# Patient Record
Sex: Female | Born: 1970 | Race: White | Hispanic: No | Marital: Married | Smoking: Former smoker
Health system: Southern US, Community
[De-identification: ages and names within clinical notes are randomized; demographics above are authoritative.]

## PROBLEM LIST (undated history)

## (undated) DIAGNOSIS — R87619 Unspecified abnormal cytological findings in specimens from cervix uteri: Secondary | ICD-10-CM

## (undated) DIAGNOSIS — N816 Rectocele: Secondary | ICD-10-CM

## (undated) DIAGNOSIS — K219 Gastro-esophageal reflux disease without esophagitis: Secondary | ICD-10-CM

## (undated) DIAGNOSIS — D219 Benign neoplasm of connective and other soft tissue, unspecified: Secondary | ICD-10-CM

## (undated) HISTORY — DX: Rectocele: N81.6

## (undated) HISTORY — DX: Benign neoplasm of connective and other soft tissue, unspecified: D21.9

## (undated) HISTORY — DX: Unspecified abnormal cytological findings in specimens from cervix uteri: R87.619

## (undated) HISTORY — DX: Gastro-esophageal reflux disease without esophagitis: K21.9

---

## 2005-11-27 HISTORY — PX: CERVIX SURGERY: SHX593

## 2008-12-20 ENCOUNTER — Inpatient Hospital Stay (HOSPITAL_COMMUNITY): Admission: AD | Admit: 2008-12-20 | Discharge: 2008-12-24 | Payer: Self-pay | Admitting: Obstetrics and Gynecology

## 2008-12-22 ENCOUNTER — Encounter (INDEPENDENT_AMBULATORY_CARE_PROVIDER_SITE_OTHER): Payer: Self-pay | Admitting: Obstetrics and Gynecology

## 2010-11-26 ENCOUNTER — Inpatient Hospital Stay (HOSPITAL_COMMUNITY): Admission: AD | Admit: 2010-11-26 | Payer: Self-pay | Admitting: Obstetrics and Gynecology

## 2011-03-13 LAB — RPR: RPR Ser Ql: NONREACTIVE

## 2011-03-13 LAB — CBC
HCT: 30.5 % — ABNORMAL LOW (ref 36.0–46.0)
HCT: 40.1 % (ref 36.0–46.0)
Hemoglobin: 10.4 g/dL — ABNORMAL LOW (ref 12.0–15.0)
Hemoglobin: 13.6 g/dL (ref 12.0–15.0)
MCHC: 33.9 g/dL (ref 30.0–36.0)
Platelets: 185 10*3/uL (ref 150–400)
Platelets: 208 10*3/uL (ref 150–400)
RBC: 3.29 MIL/uL — ABNORMAL LOW (ref 3.87–5.11)
RBC: 4.1 MIL/uL (ref 3.87–5.11)
RDW: 13.4 % (ref 11.5–15.5)
WBC: 10.4 10*3/uL (ref 4.0–10.5)
WBC: 9 10*3/uL (ref 4.0–10.5)

## 2011-03-14 ENCOUNTER — Inpatient Hospital Stay (HOSPITAL_COMMUNITY)
Admission: AD | Admit: 2011-03-14 | Discharge: 2011-03-15 | DRG: 373 | Disposition: A | Discharge status: Home / Self Care | Payer: BC Managed Care – PPO | Source: Ambulatory Visit | Attending: Obstetrics & Gynecology | Admitting: Obstetrics & Gynecology

## 2011-03-14 LAB — CBC
Hemoglobin: 12.3 g/dL (ref 12.0–15.0)
MCH: 29.3 pg (ref 26.0–34.0)
MCHC: 32.5 g/dL (ref 30.0–36.0)
MCV: 90.2 fL (ref 78.0–100.0)
RBC: 4.2 MIL/uL (ref 3.87–5.11)

## 2011-03-14 LAB — RPR: RPR Ser Ql: NONREACTIVE

## 2011-03-15 LAB — CBC
HCT: 36 % (ref 36.0–46.0)
Hemoglobin: 11.8 g/dL — ABNORMAL LOW (ref 12.0–15.0)
MCH: 29.6 pg (ref 26.0–34.0)
MCHC: 32.8 g/dL (ref 30.0–36.0)
MCV: 90.2 fL (ref 78.0–100.0)
RDW: 13.9 % (ref 11.5–15.5)

## 2013-07-21 ENCOUNTER — Other Ambulatory Visit: Payer: Self-pay

## 2013-07-21 DIAGNOSIS — Z1231 Encounter for screening mammogram for malignant neoplasm of breast: Secondary | ICD-10-CM

## 2013-08-18 ENCOUNTER — Ambulatory Visit
Admission: RE | Admit: 2013-08-18 | Discharge: 2013-08-18 | Disposition: A | Discharge status: Home / Self Care | Payer: BC Managed Care – PPO | Source: Ambulatory Visit

## 2013-08-18 DIAGNOSIS — Z1231 Encounter for screening mammogram for malignant neoplasm of breast: Secondary | ICD-10-CM

## 2013-08-21 ENCOUNTER — Other Ambulatory Visit: Payer: Self-pay | Admitting: Obstetrics and Gynecology

## 2013-08-21 DIAGNOSIS — R928 Other abnormal and inconclusive findings on diagnostic imaging of breast: Secondary | ICD-10-CM

## 2013-09-23 ENCOUNTER — Ambulatory Visit
Admission: RE | Admit: 2013-09-23 | Discharge: 2013-09-23 | Disposition: A | Discharge status: Home / Self Care | Payer: BC Managed Care – PPO | Source: Ambulatory Visit | Attending: Obstetrics and Gynecology | Admitting: Obstetrics and Gynecology

## 2013-09-23 DIAGNOSIS — R928 Other abnormal and inconclusive findings on diagnostic imaging of breast: Secondary | ICD-10-CM

## 2016-01-19 ENCOUNTER — Other Ambulatory Visit: Payer: Self-pay

## 2016-01-19 DIAGNOSIS — Z1231 Encounter for screening mammogram for malignant neoplasm of breast: Secondary | ICD-10-CM

## 2016-02-14 ENCOUNTER — Ambulatory Visit
Admission: RE | Admit: 2016-02-14 | Discharge: 2016-02-14 | Disposition: A | Discharge status: Home / Self Care | Payer: BLUE CROSS/BLUE SHIELD | Source: Ambulatory Visit

## 2016-02-14 DIAGNOSIS — Z1231 Encounter for screening mammogram for malignant neoplasm of breast: Secondary | ICD-10-CM

## 2017-04-11 DIAGNOSIS — Z0000 Encounter for general adult medical examination without abnormal findings: Secondary | ICD-10-CM | POA: Diagnosis not present

## 2017-04-11 DIAGNOSIS — R5382 Chronic fatigue, unspecified: Secondary | ICD-10-CM | POA: Diagnosis not present

## 2017-04-11 DIAGNOSIS — N951 Menopausal and female climacteric states: Secondary | ICD-10-CM | POA: Diagnosis not present

## 2017-04-11 DIAGNOSIS — Z23 Encounter for immunization: Secondary | ICD-10-CM | POA: Diagnosis not present

## 2017-09-25 DIAGNOSIS — Z1231 Encounter for screening mammogram for malignant neoplasm of breast: Secondary | ICD-10-CM | POA: Diagnosis not present

## 2017-09-25 DIAGNOSIS — Z1151 Encounter for screening for human papillomavirus (HPV): Secondary | ICD-10-CM | POA: Diagnosis not present

## 2017-09-25 DIAGNOSIS — Z6823 Body mass index (BMI) 23.0-23.9, adult: Secondary | ICD-10-CM | POA: Diagnosis not present

## 2017-09-25 DIAGNOSIS — Z01419 Encounter for gynecological examination (general) (routine) without abnormal findings: Secondary | ICD-10-CM | POA: Diagnosis not present

## 2018-07-08 ENCOUNTER — Other Ambulatory Visit: Payer: Self-pay | Admitting: Obstetrics and Gynecology

## 2018-07-08 DIAGNOSIS — Z1231 Encounter for screening mammogram for malignant neoplasm of breast: Secondary | ICD-10-CM

## 2018-08-15 ENCOUNTER — Ambulatory Visit: Payer: BLUE CROSS/BLUE SHIELD

## 2018-09-30 ENCOUNTER — Ambulatory Visit
Admission: RE | Admit: 2018-09-30 | Discharge: 2018-09-30 | Disposition: A | Discharge status: Home / Self Care | Payer: BLUE CROSS/BLUE SHIELD | Source: Ambulatory Visit | Attending: Obstetrics and Gynecology | Admitting: Obstetrics and Gynecology

## 2018-09-30 DIAGNOSIS — Z1231 Encounter for screening mammogram for malignant neoplasm of breast: Secondary | ICD-10-CM | POA: Diagnosis not present

## 2018-10-16 DIAGNOSIS — Z130 Encounter for screening for diseases of the blood and blood-forming organs and certain disorders involving the immune mechanism: Secondary | ICD-10-CM | POA: Diagnosis not present

## 2018-10-16 DIAGNOSIS — Z1151 Encounter for screening for human papillomavirus (HPV): Secondary | ICD-10-CM | POA: Diagnosis not present

## 2018-10-16 DIAGNOSIS — Z0000 Encounter for general adult medical examination without abnormal findings: Secondary | ICD-10-CM | POA: Diagnosis not present

## 2018-10-16 DIAGNOSIS — Z6823 Body mass index (BMI) 23.0-23.9, adult: Secondary | ICD-10-CM | POA: Diagnosis not present

## 2018-10-16 DIAGNOSIS — Z1329 Encounter for screening for other suspected endocrine disorder: Secondary | ICD-10-CM | POA: Diagnosis not present

## 2018-10-16 DIAGNOSIS — Z01419 Encounter for gynecological examination (general) (routine) without abnormal findings: Secondary | ICD-10-CM | POA: Diagnosis not present

## 2018-10-16 DIAGNOSIS — Z13220 Encounter for screening for lipoid disorders: Secondary | ICD-10-CM | POA: Diagnosis not present

## 2018-11-27 HISTORY — PX: BLEPHAROPLASTY: SUR158

## 2019-04-28 ENCOUNTER — Telehealth: Payer: Self-pay | Admitting: *Deleted

## 2019-04-28 NOTE — Telephone Encounter (Signed)
-----   Message from Joselyn Arrow, MD sent at 04/28/2019  8:30 AM EDT ----- If she can fax her labs, we can do a virtual establish care visit this week ----- Message ----- From: Melonie Florida Sent: 04/28/2019   8:11 AM EDT To: Joselyn Arrow, MD  This was sent to me. ----- Message ----- From: Galvin Proffer Sent: 04/25/2019  10:35 AM EDT To: Melonie Florida  Dr. Lynelle Doctor has agreed to accept her as a new pt. She called Friday wanting a visit because her white blood count is abnormal. Labs were done be her OBGYN and she does have a copy of them. Due to Dr. Madaline Savage reduced scheduled I'm not sure where to put her. Please advise pt at [phone]. I will not be in the office Monday.   Georgiann Hahn

## 2019-04-28 NOTE — Telephone Encounter (Signed)
Patient was on her way to the beach so she could not do today. Offered her Thursday and she said that she would be driving back and she would rather do a virtual visit next week. Her WBC's were low, btw.

## 2019-06-16 ENCOUNTER — Telehealth: Payer: Self-pay | Admitting: General Practice

## 2019-06-16 NOTE — Telephone Encounter (Signed)
I previously advised that she needs to get [place] her lab results before any visit (so I would know what/when to follow up). If blood test isn't needed yet, then would do a virtual establish care.  All prior lab tests from her GYN (the last one, which was abnormal, but any prior ones are also desired, if there are any).

## 2019-06-16 NOTE — Telephone Encounter (Signed)
Got copy of lab work and put in Engineer, civil (consulting)

## 2019-06-16 NOTE — Telephone Encounter (Signed)
Pt called and is requesting a appt to get est, she wanted to know if you wanted her to come in for get est or do a virtual app, she didn't know if you wanted her to do labs, or if she could just do a virtual appt, pt checked to no to everything on the screening expect that she has traveled to the [employer], pt can be reached at [phone]

## 2019-06-16 NOTE — Telephone Encounter (Signed)
Her labs were from 09/2018, so it is time to repeat them.  So, assuming she passes screening measures, she should have an in-office establish care visit.  Thanks

## 2019-06-17 NOTE — Telephone Encounter (Signed)
I scheduled pt for august the 3rd at 10.30 and she wants to know if she needs to be fasting, states she can not fast that long, but that is the earliest appt you have in the am, and she does not want to do 2 sperate appts

## 2019-06-17 NOTE — Telephone Encounter (Signed)
You don't need to be fasting for a CBC (which is what showed the abnormality and is due to be repeated).  I did not receive any records from anyone other than the 1 lab result, so I don't know if any other bloodwork is needed (ie when her last cholesterol was, etc), so that won't be done at this visit.  She doesn't need to fast, and she can sign full release of records to get other records sent when she is here (ie from her GYN)

## 2019-06-18 NOTE — Telephone Encounter (Signed)
Called and informed pt.  

## 2019-06-28 NOTE — Progress Notes (Signed)
Chief Complaint  Patient presents with  . Establish Care    establish care. Did mention that she was looking to see an allergist for chronic swollen sinuses. Wasn't sure if we needed to refer or she could just call.     Patient presents to establish care.  She is due for repeat CBC after she was found to have low WBC count by her GYN.  Labs from [facility] 10/16/2018 showed WBC 3.2, Hg 13.1, Hct 39.9, plt 229.  She first was noted to have a low white count through Dr. Sheryn Bison at [place] (but had a slight sore throat at that time).  On reviewing labs from portal on her phone, looks like 07/2014 WBC was 3.6, ANC 1.8.  She had repeat CBC in 03/2017 which was normal (WBC 4.9, ANC 3.6).   Also brings in rest of labs done 09/2018 TC 184, TG 58, HDL 69, LDL 103 Normal TSH 0.84, normal chem, glu 97  After 42, issues with energy, trouble with her weight.  Lab tests were normal. She has been trying some OTC hormonal agents which seem to help. She reports she feels better.  Menses are regular. No hot flashes or night sweats.  She is congested at night, wakes up, can't breathe through her nose which interferes with her sleep. She has been using a nasal steroid spray daily for about a year, which helps. It is a little worse in the winter.  Denies side effects.  Denies any sneezing, itchy eyes or other current allergy symptoms.   She is noting heartburn at night, worse if she has more wine, more frequent than in the past.  Also has a worse night sleep in general if she has more wine. She denies dysphagia.  Has used some Tums.   Past Medical History:  Diagnosis Date  . Abnormal Pap smear of cervix 2005, 2006   treated in [place]; no abnormals since having children    History reviewed. No pertinent surgical history.  Social History   Socioeconomic History  . Marital status: Married    Spouse name: Not on file  . Number of children: Not on file  . Years of education: Not on file  . Highest  education level: Not on file  Occupational History  . Not on file  Social Needs  . Financial resource strain: Not on file  . Food insecurity    Worry: Not on file    Inability: Not on file  . Transportation needs    Medical: Not on file    Non-medical: Not on file  Tobacco Use  . Smoking status: Former Smoker    Packs/day: 0.50    Years: 7.00    Pack years: 3.50  . Smokeless tobacco: Never Used  . Tobacco comment: quit age 89  Substance and Sexual Activity  . Alcohol use: Yes    Alcohol/week: 10.0 standard drinks    Types: 10 Glasses of wine per week    Comment: 1-2 glasses 4-5 days/week  . Drug use: Never  . Sexual activity: Yes    Partners: Male    Birth control/protection: Condom  Lifestyle  . Physical activity    Days per week: Not on file    Minutes per session: Not on file  . Stress: Not on file  Relationships  . Social Herbalist on phone: Not on file    Gets together: Not on file    Attends religious service: Not on file  Active member of club or organization: Not on file    Attends meetings of clubs or organizations: Not on file    Relationship status: Not on file  Other Topics Concern  . Not on file  Social History Narrative   Married, lives with husband and 2 daughters, 1 hamster.      Previously worked in [employer].   From [place].     Family History  Problem Relation Age of Onset  . Asthma Mother   . Colon cancer Maternal Grandfather        >75  . Colon cancer Paternal Grandfather        >75    Outpatient Encounter Medications as of 06/30/2019  Medication Sig Note  . Multiple Vitamins-Minerals (MULTIVITAMIN WITH MINERALS) tablet Take 1 tablet by mouth daily. 06/30/2019: Maybe 3-4 times a week  . NON FORMULARY Take 1 tablet by mouth daily. 06/30/2019: Hormone balance(maybe 5-7 times a week)  . NON FORMULARY Take 1 tablet by mouth daily. 06/30/2019: Estrogen balance (maybe 5-7 times a week)  . Probiotic Product  (PRO-BIOTIC BLEND PO) Take 1 capsule by mouth daily. 06/30/2019: Maybe 3-4 times a week   No facility-administered encounter medications on file as of 06/30/2019.     No Known Allergies  ROS:  No fever, chills, URI symptoms, cough, shortness of breath, chest pain.  No bowel changes. Heart burn frequently at night, no dysphagia.  No urinary complaints, bleeding, bruising, rash, abnormal vaginal bleeding, vaginal discharge or other complaints. Occasionally gets a little dizzy/woozy if she stands too quickly. Nasal congestion per HPI.   PHYSICAL EXAM:  BP 94/60   Pulse 64   Temp 98.2 F (36.8 C) (Temporal)   Ht 5\' 3"  (1.6 m)   Wt 129 lb 12.8 oz (58.9 kg)   LMP 06/03/2019 (Approximate)   BMI 22.99 kg/m   Well-appearing, pleasant female, in no distress HEENT: conjunctiva and sclera are clear, EOMI. TM's and EAC's normal. Nasal mucosa only minimally edematous, no erythema or purulence. Sinuses nontender. OP not examined (wearing mask due to COVID-19--nasal exam performed due to complaint). Neck: no lymphadenopathy, thyromegaly or carotid bruit Heart: regular rate and rhythm, no murmur Lungs: clear bilaterally Back: no spinal or CVA tenderness Abdomen: soft, nontender, no organomegaly or mass Extremities: no edema, 2+ pulses Skin: tan, no rashes Psych: normal mood, affect, hygiene, grooming Neuro: alert and oriented, cranial nerves grossly intact (limited by mask) Normal gait.  ASSESSMENT/PLAN:  Abnormal white blood cell (WBC) count - Plan: CBC with Differential/Platelet,   Gastroesophageal reflux disease without esophagitis - counseled re: diet, elevating HOB, OTC meds--suggested Pepcid with dinner due to frequent nighttime sx   Allergic rhinitis, unspecified seasonality, unspecified trigger - discussed safety of longterm nasal steroids, and potential to add antihistamine if needed, along with nasal saline   Discussed potential causes of low WBC, what levels are worrisome, and what  next steps would be if we see a pattern of decline.  Last ANC was fine, not at higher risk for infections (per November labs, being repeated today)   ROR Dr. Abigail Miyamotohacker

## 2019-06-30 ENCOUNTER — Other Ambulatory Visit: Payer: Self-pay

## 2019-06-30 ENCOUNTER — Ambulatory Visit (INDEPENDENT_AMBULATORY_CARE_PROVIDER_SITE_OTHER): Payer: BLUE CROSS/BLUE SHIELD | Admitting: Family Medicine

## 2019-06-30 ENCOUNTER — Encounter: Payer: Self-pay | Admitting: Family Medicine

## 2019-06-30 VITALS — BP 94/60 | HR 64 | Temp 98.2°F | Ht 63.0 in | Wt 129.8 lb

## 2019-06-30 DIAGNOSIS — K219 Gastro-esophageal reflux disease without esophagitis: Secondary | ICD-10-CM | POA: Diagnosis not present

## 2019-06-30 DIAGNOSIS — J309 Allergic rhinitis, unspecified: Secondary | ICD-10-CM | POA: Diagnosis not present

## 2019-06-30 DIAGNOSIS — D729 Disorder of white blood cells, unspecified: Secondary | ICD-10-CM

## 2019-06-30 LAB — CBC WITH DIFFERENTIAL/PLATELET
Basophils Absolute: 0.1 10*3/uL (ref 0.0–0.2)
Basos: 2 %
EOS (ABSOLUTE): 0.1 10*3/uL (ref 0.0–0.4)
Eos: 2 %
Hematocrit: 40.4 % (ref 34.0–46.6)
Hemoglobin: 13.5 g/dL (ref 11.1–15.9)
Immature Grans (Abs): 0 10*3/uL (ref 0.0–0.1)
Immature Granulocytes: 0 %
Lymphocytes Absolute: 1.5 10*3/uL (ref 0.7–3.1)
Lymphs: 33 %
MCH: 29.8 pg (ref 26.6–33.0)
MCHC: 33.4 g/dL (ref 31.5–35.7)
MCV: 89 fL (ref 79–97)
Monocytes Absolute: 0.3 10*3/uL (ref 0.1–0.9)
Monocytes: 8 %
Neutrophils Absolute: 2.5 10*3/uL (ref 1.4–7.0)
Neutrophils: 55 %
Platelets: 257 10*3/uL (ref 150–450)
RBC: 4.53 x10E6/uL (ref 3.77–5.28)
RDW: 12.7 % (ref 11.7–15.4)
WBC: 4.4 10*3/uL (ref 3.4–10.8)

## 2019-06-30 NOTE — Patient Instructions (Signed)
We will contact you with your white cell count results in the next 1-2 days.  We discussed taking Pepcid (famotidine) with dinner (or later would be okay) in order to PREVENT heartburn/reflux symptoms.  Below is some other helpful information.  We also discussed safety in using nasal steroid sprays longterm (not decongestants)--ie nasacort, flonase, rhinocort, etc. You may also use nasal saline at bedtime and as needed during the day. If your allergy symptoms aren't adequately controlled with the spray, you can add in an oral antisthistamine (ie claritin, allegra, zyrtec)--doesn't sound like you need this now, but if worse in the Spring/Fall.   Food Choices for Gastroesophageal Reflux Disease, Adult When you have gastroesophageal reflux disease (GERD), the foods you eat and your eating habits are very important. Choosing the right foods can help ease the discomfort of GERD. Consider working with a diet and nutrition specialist (dietitian) to help you make healthy food choices. What general guidelines should I follow?  Eating plan  Choose healthy foods low in fat, such as fruits, vegetables, whole grains, low-fat dairy products, and lean meat, fish, and poultry.  Eat frequent, small meals instead of three large meals each day. Eat your meals slowly, in a relaxed setting. Avoid bending over or lying down until 2-3 hours after eating.  Limit high-fat foods such as fatty meats or fried foods.  Limit your intake of oils, butter, and shortening to less than 8 teaspoons each day.  Avoid the following: ? Foods that cause symptoms. These may be different for different people. Keep a food diary to keep track of foods that cause symptoms. ? Alcohol. ? Drinking large amounts of liquid with meals. ? Eating meals during the 2-3 hours before bed.  Cook foods using methods other than frying. This may include baking, grilling, or broiling. Lifestyle  Maintain a healthy weight. Ask your health care  provider what weight is healthy for you. If you need to lose weight, work with your health care provider to do so safely.  Exercise for at least 30 minutes on 5 or more days each week, or as told by your health care provider.  Avoid wearing clothes that fit tightly around your waist and chest.  Do not use any products that contain nicotine or tobacco, such as cigarettes and e-cigarettes. If you need help quitting, ask your health care provider.  Sleep with the head of your bed raised. Use a wedge under the mattress or blocks under the bed frame to raise the head of the bed. What foods are not recommended? The items listed may not be a complete list. Talk with your dietitian about what dietary choices are best for you. Grains Pastries or quick breads with added fat. [place] toast. Vegetables Deep fried vegetables. [place] fries. Any vegetables prepared with added fat. Any vegetables that cause symptoms. For some people this may include tomatoes and tomato products, chili peppers, onions and garlic, and horseradish. Fruits Any fruits prepared with added fat. Any fruits that cause symptoms. For some people this may include citrus fruits, such as oranges, grapefruit, pineapple, and lemons. Meats and other protein foods High-fat meats, such as fatty beef or pork, hot dogs, ribs, ham, sausage, salami and bacon. Fried meat or protein, including fried fish and fried chicken. Nuts and nut butters. Dairy Whole milk and chocolate milk. Sour cream. Cream. Ice cream. Cream cheese. Milk shakes. Beverages Coffee and tea, with or without caffeine. Carbonated beverages. Sodas. Energy drinks. Fruit juice made with acidic fruits (such as  orange or grapefruit). Tomato juice. Alcoholic drinks. Fats and oils Butter. Margarine. Shortening. Ghee. Sweets and desserts Chocolate and cocoa. Donuts. Seasoning and other foods Pepper. Peppermint and spearmint. Any condiments, herbs, or seasonings that cause symptoms. For  some people, this may include curry, hot sauce, or vinegar-based salad dressings. Summary  When you have gastroesophageal reflux disease (GERD), food and lifestyle choices are very important to help ease the discomfort of GERD.  Eat frequent, small meals instead of three large meals each day. Eat your meals slowly, in a relaxed setting. Avoid bending over or lying down until 2-3 hours after eating.  Limit high-fat foods such as fatty meat or fried foods. This information is not intended to replace advice given to you by your health care provider. Make sure you discuss any questions you have with your health care provider. Document Released: 11/13/2005 Document Revised: 03/06/2019 Document Reviewed: 11/14/2016 Elsevier Patient Education  2020 [employer]

## 2019-07-03 ENCOUNTER — Encounter: Payer: Self-pay | Admitting: General Practice

## 2019-08-11 ENCOUNTER — Other Ambulatory Visit: Payer: Self-pay

## 2019-08-11 DIAGNOSIS — Z20822 Contact with and (suspected) exposure to covid-19: Secondary | ICD-10-CM

## 2019-08-12 LAB — NOVEL CORONAVIRUS, NAA: SARS-CoV-2, NAA: NOT DETECTED

## 2019-10-31 ENCOUNTER — Other Ambulatory Visit: Payer: Self-pay

## 2019-10-31 DIAGNOSIS — Z20822 Contact with and (suspected) exposure to covid-19: Secondary | ICD-10-CM

## 2019-11-03 LAB — NOVEL CORONAVIRUS, NAA: SARS-CoV-2, NAA: NOT DETECTED

## 2019-11-05 ENCOUNTER — Other Ambulatory Visit: Payer: Self-pay

## 2019-11-05 ENCOUNTER — Encounter: Payer: Self-pay | Admitting: Family Medicine

## 2019-11-05 ENCOUNTER — Ambulatory Visit: Payer: BC Managed Care – PPO | Admitting: Family Medicine

## 2019-11-05 VITALS — BP 110/68 | HR 60 | Temp 97.5°F | Ht 63.0 in | Wt 134.2 lb

## 2019-11-05 DIAGNOSIS — M79661 Pain in right lower leg: Secondary | ICD-10-CM

## 2019-11-05 MED ORDER — MELOXICAM 15 MG PO TABS: 15.0000 mg | ORAL_TABLET | Freq: Every day | ORAL | 0 refills | Status: DC

## 2019-11-05 NOTE — Progress Notes (Signed)
Chief Complaint  Patient presents with  . Muscle Pain    right calf pain. Played tennis last Wed and heard something pop. Having a tough time walking.     1 week ago (12/2), while pushing off to reach a ball in front of her while playing tennis, she heard a pop and had acute onset of pain in her right calf.  It was swollen. She iced it  2 days, 3x/day. She is limping, can't walk normally. It hurts when the calf gets stretched when walking. 3 days ago she walked more than usual (at [place] [facility])--didn't have any pain (just not walking normally), but the following night (2 nights ago), it felt achey at night, woke her up.  More discomfort yesterday and last night.  She needed to take pain meds (either tylenol or ibuprofen) last night.  She describes it as a throbbing pain. Talked to Dr. Vivi Martens last night (a friend), who said she might need an ultrasound.  She sat a lot this week, working from home.  Today she started elevated her legs, wasn't doing that earlier this week. Has been wearing compression socks since the second day from her injury, and this seems to help.  Pain seems to move around throughout the calf.  Sometimes the pain is lateral, sometimes medial, sometimes lower down in the calf, and across the whole back.   PMH, PSH, SH reviewed  Not currently taking any medications Current Outpatient Medications on File Prior to Visit  Medication Sig Dispense Refill  . Multiple Vitamins-Minerals (MULTIVITAMIN WITH MINERALS) tablet Take 1 tablet by mouth daily.    . NON FORMULARY Take 1 tablet by mouth daily.    . NON FORMULARY Take 1 tablet by mouth daily.    . Probiotic Product (PRO-BIOTIC BLEND PO) Take 1 capsule by mouth daily.     No current facility-administered medications on file prior to visit.    No Known Allergies  ROS: no fever, chills, URI symptoms, chest pain, shortness of breath or GI complaints.  Right calf pain per HPI.  No bleeding,  bruising, rash.   PHYSICAL EXAM:  BP 110/68   Pulse 60   Temp (!) 97.5 F (36.4 C) (Tympanic)   Ht 5\' 3"  (1.6 m)   Wt 134 lb 3.2 oz (60.9 kg)   LMP 10/23/2019 (Exact Date)   BMI 23.77 kg/m   Pleasant, well-appearing female, in no distress. HEENT: conjunctiva and sclera are clear, EOMI.  Wearing mask Lower extremities: 2+ pulses on the right. She is wearing compression sock. This was pulled down for exam--no pitting edema. Calf circumference is equal.  There is just under a cm difference halfway between the largest part of the calf and the ankle, larger on the right. No cords. No reproducible tenderness. She had some discomfort when standing on toes.  Feels tight with dorsiflexion. Normal strength. After sitting again, after doing strength testing, she developed more discomfort at the medial gastroc.  ASSESSMENT/PLAN:  Right calf pain - Plan: meloxicam (MOBIC) 15 MG tablet  History would be consistent with type of mechanism for medial gastroc tear, but there doesn't appear to be any significant tear (no bruising, bulge).  Will treat with NSAID, heat, compression, elevation, and PT as planned. Reviewed s/sx for DVT, and discussed the need for [place] if worsening pain/swelling,and she will contact us if this develops.

## 2019-11-05 NOTE — Patient Instructions (Addendum)
I don't think you have a significant tear (as described below), as there wasn't any bruising.  You also have pain in more than one location.  But this is the typical injury in tennis players. I suspect it is more of a strain of the gastroc or soleus muscles.  Take the meloxicam once daily with food.  Take this until your calf pain has resolved (which may only be 7-10 days, or can take up to the full 15 day supply).  Continue compression and elevation. Stay somewhat active (keep walking some, to avoid blood clots). You can alternate ice and heat (ice after activity, otherwise heat might work better).  Go to physical therapy, as planned. Contact us if you develop increasing swelling and pain--we may need to send you for an ultrasound to rule out a blood clot (no evidence to suggest this today).   Medial Head Gastrocnemius Tear  Medial head gastrocnemius tear, also called tennis leg, is an injury to the inner part of the calf muscle. This injury may include overstretching of the muscle or a partial or complete tear. This is a common sports injury. Calf muscle tears usually occur near the back of the knee. This often causes sudden pain and muscle weakness. What are the causes? This condition is caused by forceful stretching or strain on the calf muscle. This usually happens when you forcefully push off of your foot. It may also happen if you forcefully straighten your knee while your foot is flat on the ground. What increases the risk? The following factors may make you more likely to develop this condition:  Being female and older than age 20.  Playing sports that involve: ? Quick increases in speed and changes of direction, such as tennis and soccer. ? Jumping, such as basketball. ? Running, especially uphill or on uneven ground. What are the signs or symptoms? Symptoms of this condition include:  Sudden pain in the back of the leg. You may hear a noise, like a pop or a snap at the time of  injury.  Pain that gets worse when you bring your toes up toward your shin or when you straighten your knee.  Pain on the inside of your calf, from your knee to your ankle.  Pain when pressing on your calf muscle.  Swelling and bruising along your calf and lower leg, down to your ankle. This may worsen for the first 2 days before getting better.  Not being able to rise up on your toes.  Difficulty pushing off your foot when walking or using stairs. How is this diagnosed? This condition may be diagnosed based on:  Your symptoms and medical history.  A physical exam. Your health care provider may be able to feel a lump or a defect in your muscle.  An MRI or ultrasound to determine the severity and exact location of your injury. How is this treated? Treatment for this condition may include:  Resting the muscle and keeping weight off your leg for several days. During this time, you may use crutches or another walking device.  Using a splint to keep your ankle or knee in a stable position.  Wearing a walking boot to decrease the use of your gastrocnemius muscle.  Using a wedge under your heel to reduce stretching of your healing muscle.  Wearing a compression sleeve around your calf muscle.  Icing the muscle.  Raising (elevating) your leg when resting.  Taking medicine for pain and swelling, such as NSAIDs or steroids.  Taking medicine for muscle spasms.  Doing leg exercises as told by your health care provider or physical therapist. Follow these instructions at home: Medicines  Take over-the-counter and prescription medicines only as told by your health care provider.  Ask your health care provider if the medicine prescribed to you requires you to avoid driving or using heavy machinery.  Talk with your health care provider before you take any medicines that contain aspirin. Aspirin increases your risk for bleeding at the injured area. If you have a splint, boot, or  compression sleeve:  Wear it as told by your health care provider. Remove it only as told by your health care provider.  Loosen the splint, boot, or sleeve if your toes tingle, become numb, or turn cold and blue.  Keep the splint, boot, or sleeve clean.  If the splint, boot, or sleeve is not waterproof: ? Do not let it get wet. ? Cover it with a watertight covering when you take a bath or shower. Managing pain, stiffness, and swelling   If directed, put ice on the injured area. ? If you have a removable splint, boot, or sleeve, remove it as told by your health care provider. ? Put ice in a plastic bag. ? Place a towel between your skin and the bag. ? Leave the ice on for 20 minutes, 2-3 times a day.  Move your toes often to reduce stiffness and swelling.  Elevate the injured area above the level of your heart while you are sitting or lying down. Activity  Return gradually to your normal activities as told by your health care provider. Ask your health care provider what activities are safe for you.  Do not use the injured limb to support your body weight until your health care provider says that you can. Use crutches as told by your health care provider.  Do exercises as told by your health care provider.  Return to sporting activity only as told by your health care provider or physical therapist. Full recovery may take several months. General instructions  Ask your health care provider when it is safe to drive.  Do not use any products that contain nicotine or tobacco, such as cigarettes, e-cigarettes, and chewing tobacco. If you need help quitting, ask your health care provider.  Keep all follow-up visits as told by your health care provider. This is important. How is this prevented?  Warm up and stretch before being active.  Cool down and stretch after being active.  Give your body time to rest between periods of activity.  Make sure to use equipment that fits you.   Be safe and responsible while being active to avoid falls.  Maintain physical fitness, including: ? Strength. ? Flexibility. Contact a health care provider if:  Your symptoms do not improve with rest and treatment. Get help right away if:  You have swelling or redness in your calf that is getting worse.  Your skin or toenails turn blue or gray, feel cold, or become numb. Summary  Medial head gastrocnemius tear, also called tennis leg, is an injury to the inner part of the calf muscle.  Follow instructions as told by your health care provider for resting, icing, compressing, and elevating your leg.  Take over-the-counter and prescription medicines only as told by your health care provider.  Contact a health care provider if your symptoms do not improve with rest and treatment. This information is not intended to replace advice given to you by your health  care provider. Make sure you discuss any questions you have with your health care provider. Document Released: 11/13/2005 Document Revised: 10/09/2018 Document Reviewed: 10/09/2018 Elsevier Patient Education  2020 [employer]

## 2019-11-07 DIAGNOSIS — S86911D Strain of unspecified muscle(s) and tendon(s) at lower leg level, right leg, subsequent encounter: Secondary | ICD-10-CM | POA: Diagnosis not present

## 2019-11-14 DIAGNOSIS — S86911D Strain of unspecified muscle(s) and tendon(s) at lower leg level, right leg, subsequent encounter: Secondary | ICD-10-CM | POA: Diagnosis not present

## 2019-11-19 DIAGNOSIS — S86911D Strain of unspecified muscle(s) and tendon(s) at lower leg level, right leg, subsequent encounter: Secondary | ICD-10-CM | POA: Diagnosis not present

## 2019-11-25 DIAGNOSIS — M79661 Pain in right lower leg: Secondary | ICD-10-CM | POA: Diagnosis not present

## 2019-11-25 DIAGNOSIS — M79604 Pain in right leg: Secondary | ICD-10-CM | POA: Diagnosis not present

## 2020-01-01 ENCOUNTER — Other Ambulatory Visit: Payer: Self-pay | Admitting: Obstetrics and Gynecology

## 2020-01-01 DIAGNOSIS — Z1231 Encounter for screening mammogram for malignant neoplasm of breast: Secondary | ICD-10-CM

## 2020-01-11 DIAGNOSIS — Z20828 Contact with and (suspected) exposure to other viral communicable diseases: Secondary | ICD-10-CM | POA: Diagnosis not present

## 2020-01-30 ENCOUNTER — Other Ambulatory Visit: Payer: BC Managed Care – PPO

## 2020-02-05 ENCOUNTER — Ambulatory Visit: Payer: BC Managed Care – PPO

## 2020-02-13 ENCOUNTER — Other Ambulatory Visit: Payer: Self-pay

## 2020-02-13 ENCOUNTER — Ambulatory Visit
Admission: RE | Admit: 2020-02-13 | Discharge: 2020-02-13 | Disposition: A | Discharge status: Home / Self Care | Payer: BC Managed Care – PPO | Source: Ambulatory Visit | Attending: Obstetrics and Gynecology | Admitting: Obstetrics and Gynecology

## 2020-02-13 DIAGNOSIS — Z1231 Encounter for screening mammogram for malignant neoplasm of breast: Secondary | ICD-10-CM | POA: Diagnosis not present

## 2020-02-17 ENCOUNTER — Other Ambulatory Visit: Payer: Self-pay | Admitting: Obstetrics and Gynecology

## 2020-02-17 DIAGNOSIS — R928 Other abnormal and inconclusive findings on diagnostic imaging of breast: Secondary | ICD-10-CM

## 2020-02-17 IMAGING — MG DIGITAL SCREENING BILATERAL MAMMOGRAM WITH TOMO AND CAD
8 series · 9 of 24 positions shown · non-contrast
Comparison: Previous exam(s).

CLINICAL DATA: Screening.

EXAM:
DIGITAL SCREENING BILATERAL MAMMOGRAM WITH TOMO AND CAD

[R MLO synth-2D]
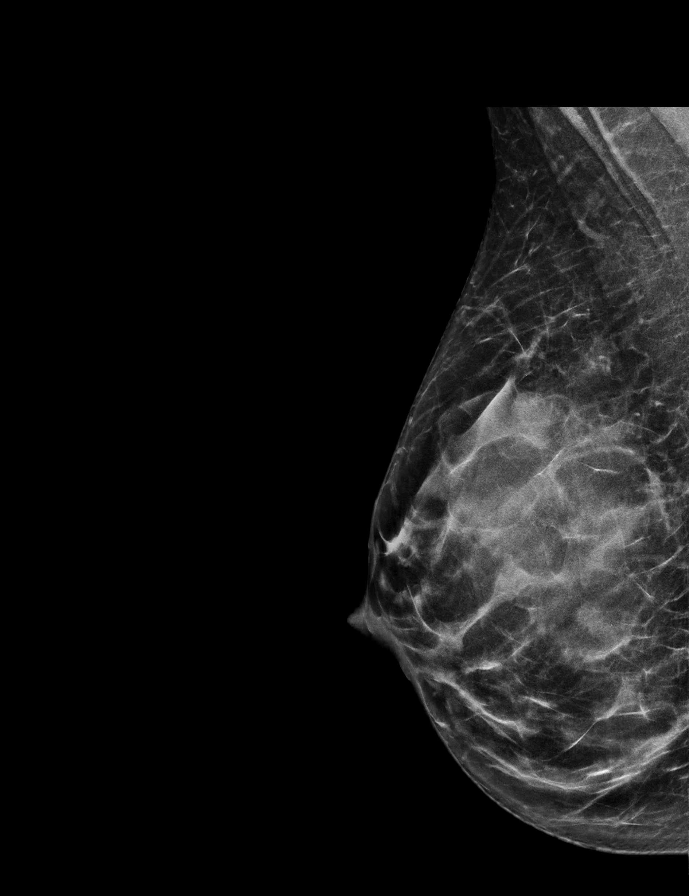

[L MLO synth-2D]
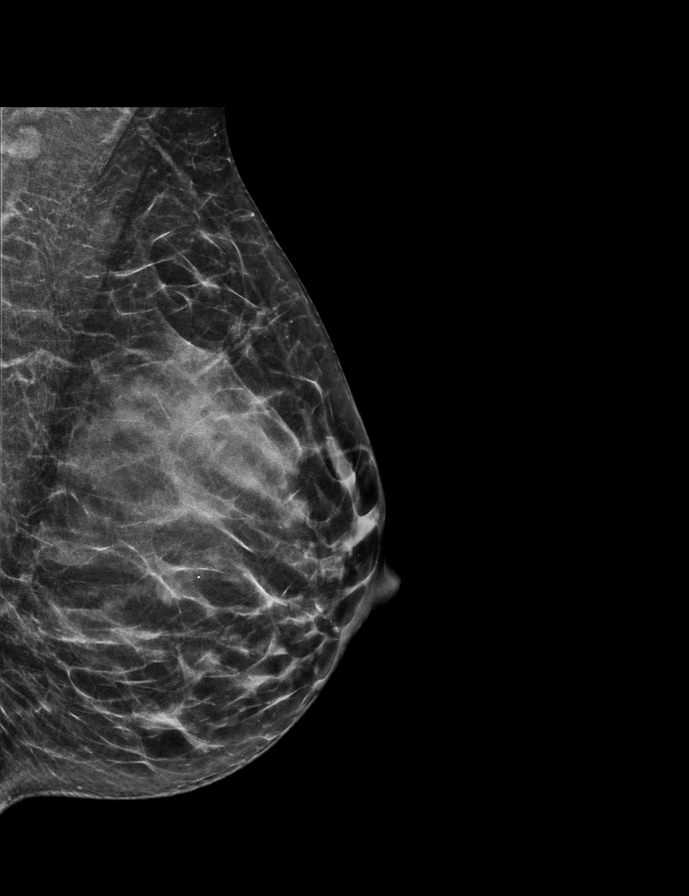

[L CC synth-2D]
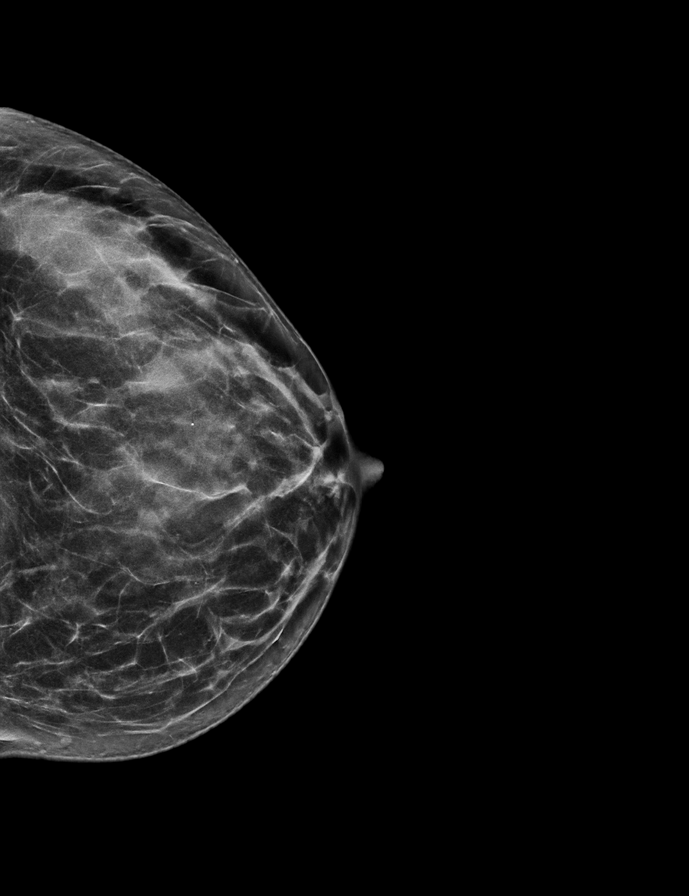

[R CC synth-2D]
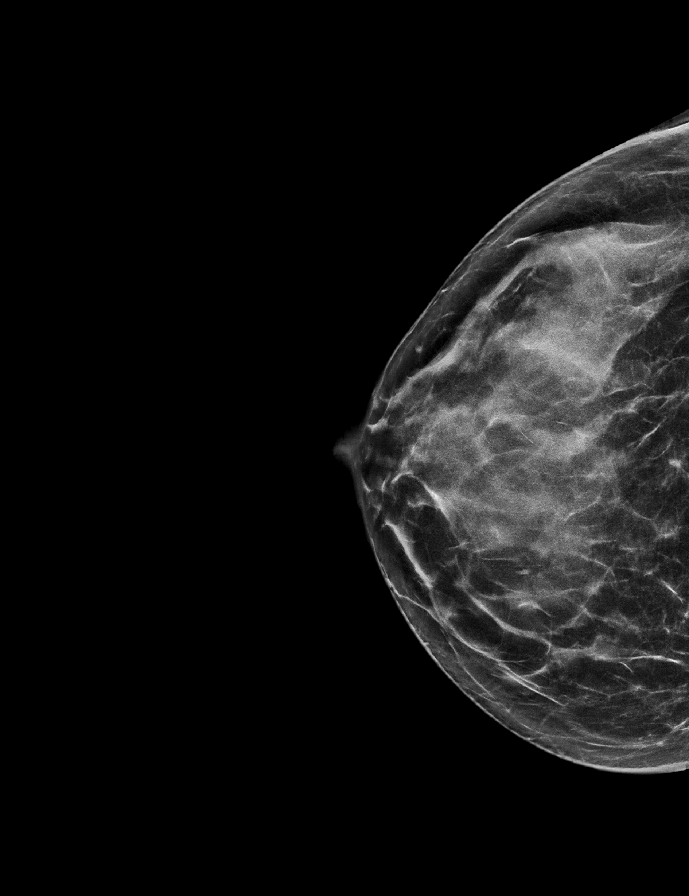

[R MLO tomo · 2 of 56 frames shown]
[frame 19/56]
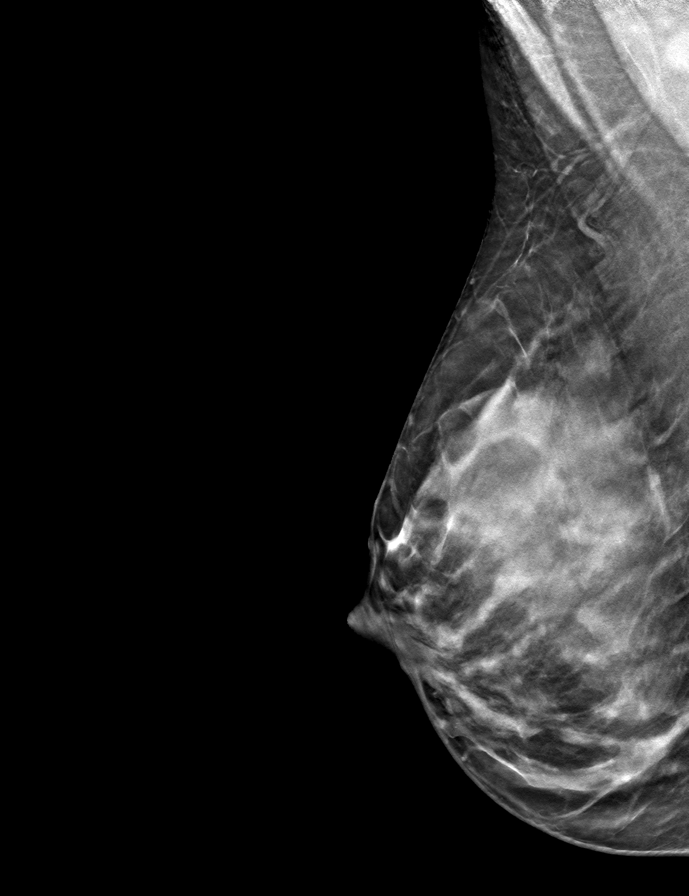
[frame 29/56]
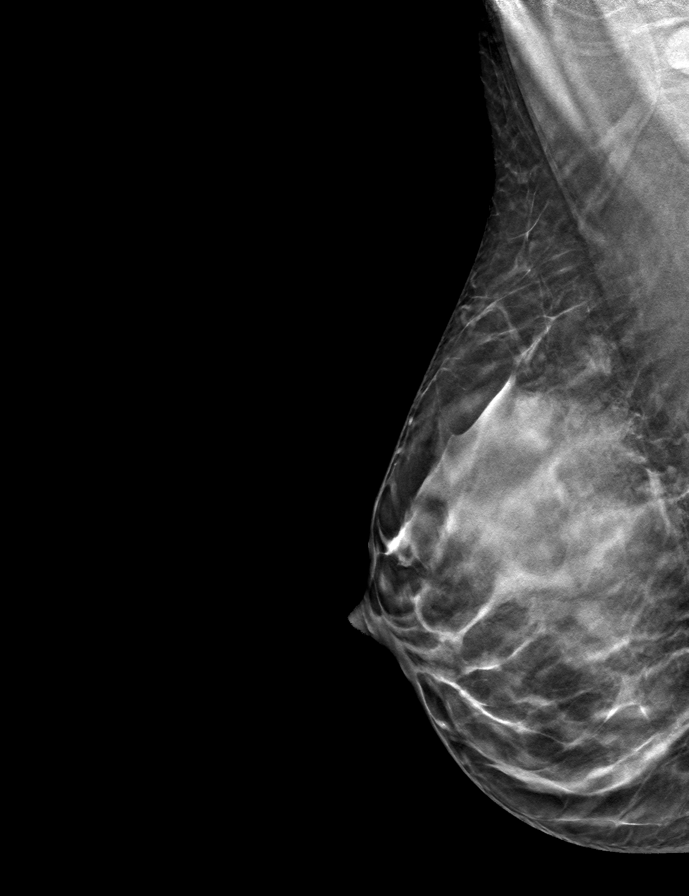

[R CC tomo · tomo slice 29/57.0]
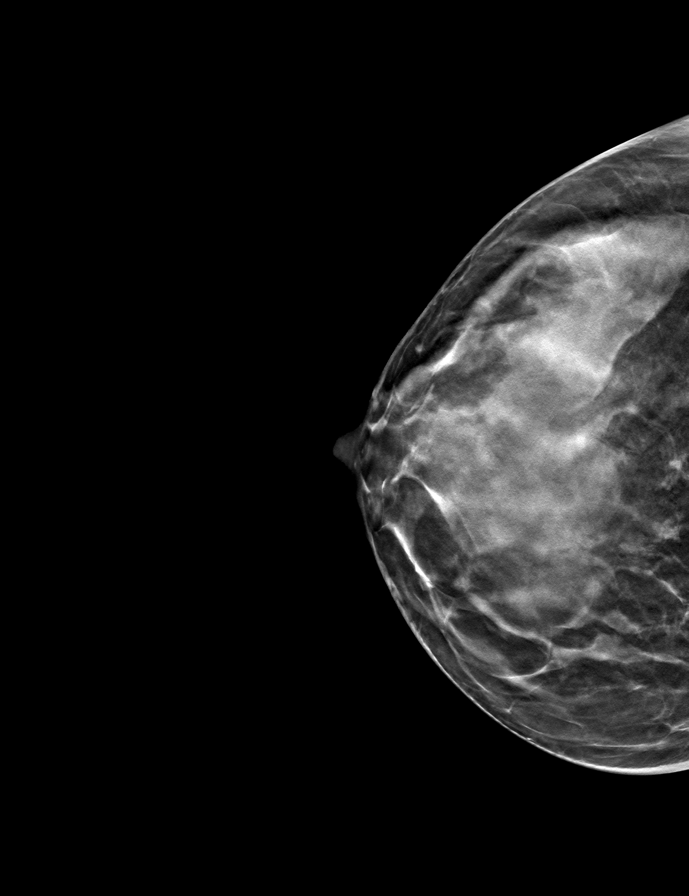

[L MLO tomo · tomo slice 28/55.0]
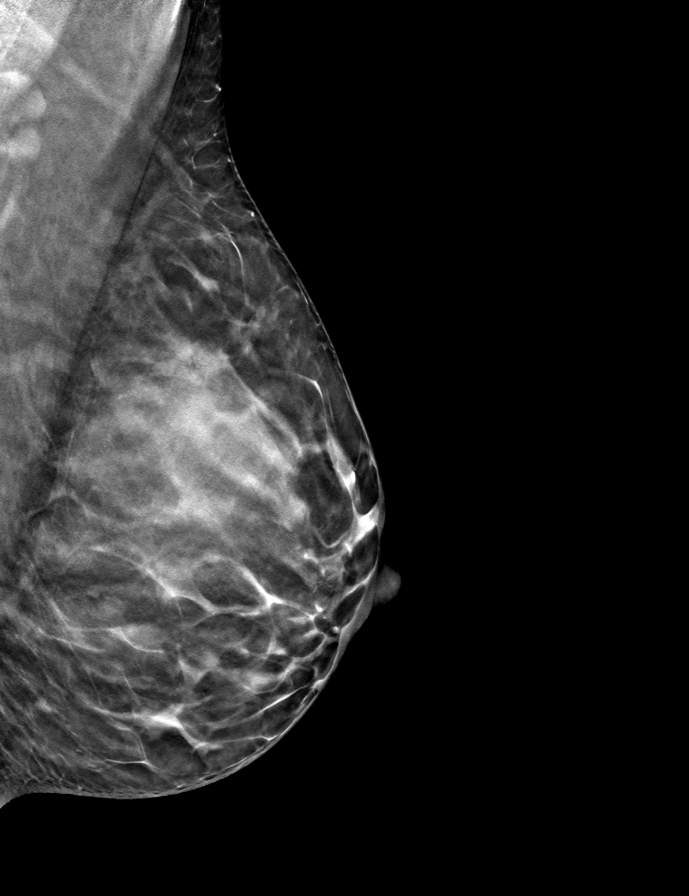

[L CC tomo · tomo slice 28/55.0]
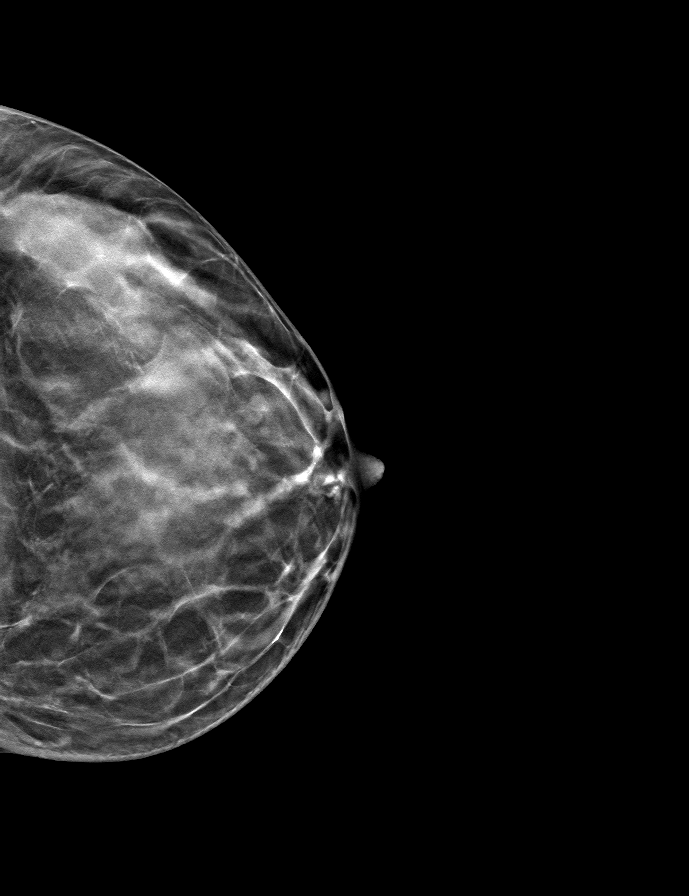

[9 of 24 positions shown; findings below may reference images not displayed]

ACR Breast Density Category c: The breast tissue is heterogeneously
dense, which may obscure small masses.
FINDINGS: There are no findings suspicious for malignancy. Images were
processed with CAD.
IMPRESSION: No mammographic evidence of malignancy. A result letter of this
screening mammogram will be mailed directly to the patient.

RECOMMENDATION:
Screening mammogram in one year. (Code:[id number])

BI-RADS CATEGORY  1: Negative.

## 2020-03-03 DIAGNOSIS — Z6824 Body mass index (BMI) 24.0-24.9, adult: Secondary | ICD-10-CM | POA: Diagnosis not present

## 2020-03-03 DIAGNOSIS — Z01419 Encounter for gynecological examination (general) (routine) without abnormal findings: Secondary | ICD-10-CM | POA: Diagnosis not present

## 2020-03-03 DIAGNOSIS — Z1151 Encounter for screening for human papillomavirus (HPV): Secondary | ICD-10-CM | POA: Diagnosis not present

## 2020-03-03 LAB — RESULTS CONSOLE HPV: CHL HPV: NEGATIVE

## 2020-03-03 LAB — HM PAP SMEAR: HM Pap smear: NEGATIVE

## 2020-03-05 ENCOUNTER — Ambulatory Visit
Admission: RE | Admit: 2020-03-05 | Discharge: 2020-03-05 | Disposition: A | Discharge status: Home / Self Care | Payer: BC Managed Care – PPO | Source: Ambulatory Visit | Attending: Obstetrics and Gynecology | Admitting: Obstetrics and Gynecology

## 2020-03-05 ENCOUNTER — Other Ambulatory Visit: Payer: Self-pay

## 2020-03-05 DIAGNOSIS — N6012 Diffuse cystic mastopathy of left breast: Secondary | ICD-10-CM | POA: Diagnosis not present

## 2020-03-05 DIAGNOSIS — R928 Other abnormal and inconclusive findings on diagnostic imaging of breast: Secondary | ICD-10-CM

## 2020-03-05 DIAGNOSIS — R922 Inconclusive mammogram: Secondary | ICD-10-CM | POA: Diagnosis not present

## 2020-10-23 DIAGNOSIS — R059 Cough, unspecified: Secondary | ICD-10-CM | POA: Diagnosis not present

## 2020-10-23 DIAGNOSIS — Z20822 Contact with and (suspected) exposure to covid-19: Secondary | ICD-10-CM | POA: Diagnosis not present

## 2020-10-26 DIAGNOSIS — R0981 Nasal congestion: Secondary | ICD-10-CM | POA: Diagnosis not present

## 2020-10-26 DIAGNOSIS — Z20822 Contact with and (suspected) exposure to covid-19: Secondary | ICD-10-CM | POA: Diagnosis not present

## 2020-10-26 DIAGNOSIS — R059 Cough, unspecified: Secondary | ICD-10-CM | POA: Diagnosis not present

## 2020-10-26 DIAGNOSIS — J029 Acute pharyngitis, unspecified: Secondary | ICD-10-CM | POA: Diagnosis not present

## 2020-10-26 DIAGNOSIS — U071 COVID-19: Secondary | ICD-10-CM | POA: Diagnosis not present

## 2020-12-29 ENCOUNTER — Other Ambulatory Visit: Payer: Self-pay | Admitting: Family Medicine

## 2020-12-29 ENCOUNTER — Telehealth: Payer: Self-pay | Admitting: Family Medicine

## 2020-12-29 ENCOUNTER — Other Ambulatory Visit: Payer: Self-pay

## 2020-12-29 DIAGNOSIS — Z1231 Encounter for screening mammogram for malignant neoplasm of breast: Secondary | ICD-10-CM

## 2020-12-29 DIAGNOSIS — N644 Mastodynia: Secondary | ICD-10-CM

## 2020-12-29 NOTE — Telephone Encounter (Signed)
Okay for diagnostic mammo order (dx breast pain, per pt)

## 2020-12-29 NOTE — Telephone Encounter (Signed)
Done KH 

## 2020-12-29 NOTE — Telephone Encounter (Signed)
Pt stated the was at the breast center for a mammogram and they asked her if she has in problems with her breast. She told them they are achy sometimes. They told her she will need another referall that includes a diagnostics scan

## 2021-01-10 ENCOUNTER — Other Ambulatory Visit: Payer: Self-pay | Admitting: Family Medicine

## 2021-01-10 ENCOUNTER — Other Ambulatory Visit: Payer: Self-pay | Admitting: *Deleted

## 2021-01-10 DIAGNOSIS — N644 Mastodynia: Secondary | ICD-10-CM

## 2021-01-12 ENCOUNTER — Ambulatory Visit: Payer: BC Managed Care – PPO

## 2021-01-12 ENCOUNTER — Ambulatory Visit
Admission: RE | Admit: 2021-01-12 | Discharge: 2021-01-12 | Disposition: A | Discharge status: Home / Self Care | Payer: BC Managed Care – PPO | Source: Ambulatory Visit | Attending: Family Medicine | Admitting: Family Medicine

## 2021-01-12 ENCOUNTER — Other Ambulatory Visit: Payer: Self-pay | Admitting: Family Medicine

## 2021-01-12 ENCOUNTER — Other Ambulatory Visit: Payer: Self-pay

## 2021-01-12 DIAGNOSIS — N6321 Unspecified lump in the left breast, upper outer quadrant: Secondary | ICD-10-CM | POA: Diagnosis not present

## 2021-01-12 DIAGNOSIS — N644 Mastodynia: Secondary | ICD-10-CM

## 2021-02-14 ENCOUNTER — Ambulatory Visit: Payer: BC Managed Care – PPO

## 2021-05-10 DIAGNOSIS — Z131 Encounter for screening for diabetes mellitus: Secondary | ICD-10-CM | POA: Diagnosis not present

## 2021-05-10 DIAGNOSIS — Z1329 Encounter for screening for other suspected endocrine disorder: Secondary | ICD-10-CM | POA: Diagnosis not present

## 2021-05-10 DIAGNOSIS — Z6824 Body mass index (BMI) 24.0-24.9, adult: Secondary | ICD-10-CM | POA: Diagnosis not present

## 2021-05-10 DIAGNOSIS — Z0000 Encounter for general adult medical examination without abnormal findings: Secondary | ICD-10-CM | POA: Diagnosis not present

## 2021-05-10 DIAGNOSIS — Z13220 Encounter for screening for lipoid disorders: Secondary | ICD-10-CM | POA: Diagnosis not present

## 2021-05-10 DIAGNOSIS — Z01419 Encounter for gynecological examination (general) (routine) without abnormal findings: Secondary | ICD-10-CM | POA: Diagnosis not present

## 2021-07-13 ENCOUNTER — Ambulatory Visit: Payer: BC Managed Care – PPO | Admitting: Family Medicine

## 2021-07-13 ENCOUNTER — Encounter: Payer: Self-pay | Admitting: Family Medicine

## 2021-07-13 ENCOUNTER — Other Ambulatory Visit: Payer: Self-pay

## 2021-07-13 VITALS — BP 118/74 | HR 56 | Temp 98.6°F | Wt 141.4 lb

## 2021-07-13 DIAGNOSIS — M79672 Pain in left foot: Secondary | ICD-10-CM | POA: Diagnosis not present

## 2021-07-13 DIAGNOSIS — N951 Menopausal and female climacteric states: Secondary | ICD-10-CM

## 2021-07-13 NOTE — Progress Notes (Signed)
Chief Complaint  Patient presents with   OTHER    Lump on lt heel just found it yesterday, has had stiffness in the morning in it, stepped on something in the house yesterday hurt really bad for 15 minutes.     Every morning when she wakes up, she has discomfort along the bottom of her left foot and heel with the first few steps.  This has been going on for a few weeks. She denies pain, just "uncomfortable".  Yesterday she was barefoot, stepped on something (piece of mulch), L heel hurt a lot, couldn't walk on it.  Felt a lump on the front part of the heel.  She got very concerned when she felt the lump. While in the office, she became aware that there was a bruise on the bottom of her foot, and she can no longer feel the lump she felt yesterday.  States she hasn't exercised much in the last 3 weeks, prior to that has been active.  At end of visit, she was asking about "hormone check", to check levels, having fatigue, thinks related to hormones.  Ongoing since about age 22.  She is still having cycles. Had labs from GYN in the Spring (no records received).  She was told her GYN doesn't check hormones, asking if we do or who does. Reports that all labs were reportedly normal.  PMH, PSH, SH reviewed  Outpatient Encounter Medications as of 07/13/2021  Medication Sig Note   meloxicam (MOBIC) 15 MG tablet Take 1 tablet (15 mg total) by mouth daily. Take with food (Patient not taking: Reported on 07/13/2021)    Multiple Vitamins-Minerals (MULTIVITAMIN WITH MINERALS) tablet Take 1 tablet by mouth daily. (Patient not taking: Reported on 07/13/2021)    NON FORMULARY Take 1 tablet by mouth daily. (Patient not taking: Reported on 07/13/2021)    NON FORMULARY Take 1 tablet by mouth daily. (Patient not taking: Reported on 07/13/2021) 06/30/2019: Estrogen balance (maybe 5-7 times a week)   Probiotic Product (PRO-BIOTIC BLEND PO) Take 1 capsule by mouth daily. (Patient not taking: Reported on 07/13/2021)    No  facility-administered encounter medications on file as of 07/13/2021.   No Known Allergies  ROS: Denies fever, chills, URI symptoms, headaches, dizziness, shortness of breath, chest pain.  Denies nausea, vomiting, diarrhea. Fatigue x years, not  worsening. Regular menstrual cycles. Foot/heel pain (L) per HPI   PHYSICAL EXAM:  BP 118/74   Pulse (!) 56   Temp 98.6 F (37 C)   Wt 141 lb 6.4 oz (64.1 kg)   LMP 06/22/2021   BMI 25.05 kg/m   Wt Readings from Last 3 Encounters:  07/13/21 141 lb 6.4 oz (64.1 kg)  11/05/19 134 lb 3.2 oz (60.9 kg)  06/30/19 129 lb 12.8 oz (58.9 kg)    Pleasant, well-appearing female in no distress. She is wearing unsupported flip flops today.  HEENT: conjunctiva and sclera are clear, EOMI, wearing mask  L foot:  2+ pulses, no edema. Ecchymosis noted on the bottom of her foot, anterior to the calcaneous. Mildly tender over this bruise only. No palpable abnormality, no mass or soft tissue swelling. Skin is intact, no foreign body. Nontender along plantar fascia, and at origin at calcaneous  Psych: normal mood, affect, hygiene and grooming.  Just slightly anxious/worried about foot.   Neuro: alert and oriented, normal strength, gait Skin: bruise on L foot, otherwise normal turgor, no lesions visible   ASSESSMENT/PLAN:  Pain of left heel - acute injury, mild  trauma w/bruising. Suspect some mild PF prior to this.  Instructed on stretches, arch support in shoes. f/u if persists/worsens  Perimenopausal - Discussed limitations regarding hormones when still having cycles. Alternative/holistic practices may be what she is looking for for treatment/guidance  She states she has had routine labs done in the spring by GYN, all reportedly normal. Only interested in further hormonal evaluation.  Mentioned holistic/alternative practices may be what she is looking for, though advised that often their evaluation and treatment is expensive, not covered by  insurance.

## 2021-07-13 NOTE — Patient Instructions (Addendum)
I do not feel any lump or concern in your foot. We saw a bruise, from the mild trauma (something just hit you wrong when you stepped on it).   The discomfort for 3 weeks suggests a possible mild plantar fasciitis. Please try and wear supportive shoes (arch supports). Do regular stretches of the arch.  You can also try massaging with a frozen water bottle (rolling the foot over it).   Plantar Fasciitis  Plantar fasciitis is a painful foot condition that affects the heel. It occurs when the band of tissue that connects the toes to the heel bone (plantar fascia) becomes irritated. This can happen as the result of exercising too much or doing other repetitive activities (overuse injury). Plantar fasciitis can cause mild irritation to severe pain that makes it difficult to walk or move. The pain is usually worse in the morning after sleeping, or after sitting or lying down for a period of time. Pain may also beworse after long periods of walking or standing. What are the causes? This condition may be caused by: Standing for long periods of time. Wearing shoes that do not have good arch support. Doing activities that put stress on joints (high-impact activities). This includes ballet and exercise that makes your heart beat faster (aerobic exercise), such as running. Being overweight. An abnormal way of walking (gait). Tight muscles in the back of your lower leg (calf). High arches in your feet or flat feet. Starting a new athletic activity. What are the signs or symptoms? The main symptom of this condition is heel pain. Pain may get worse after the following: Taking the first steps after a time of rest, especially in the morning after awakening, or after you have been sitting or lying down for a while. Long periods of standing still. Pain may decrease after 30-45 minutes of activity, such as gentle walking. How is this diagnosed? This condition may be diagnosed based on your medical history, a  physical exam, and your symptoms. Your health care provider will check for: A tender area on the bottom of your foot. A high arch in your foot or flat feet. Pain when you move your foot. Difficulty moving your foot. You may have imaging tests to confirm the diagnosis, such as: X-rays. Ultrasound. MRI. How is this treated? Treatment for plantar fasciitis depends on how severe your condition is. Treatment may include: Rest, ice, pressure (compression), and raising (elevating) the affected foot. This is called RICE therapy. Your health care provider may recommend RICE therapy along with over-the-counter pain medicines to manage your pain. Exercises to stretch your calves and your plantar fascia. A splint that holds your foot in a stretched, upward position while you sleep (night splint). Physical therapy to relieve symptoms and prevent problems in the future. Injections of steroid medicine (cortisone) to relieve pain and inflammation. Stimulating your plantar fascia with electrical impulses (extracorporeal shock wave therapy). This is usually the last treatment option before surgery. Surgery, if other treatments have not worked after 12 months. Follow these instructions at home: Managing pain, stiffness, and swelling  If directed, put ice on the painful area. To do this: Put ice in a plastic bag, or use a frozen bottle of water. Place a towel between your skin and the bag or bottle. Roll the bottom of your foot over the bag or bottle. Do this for 20 minutes, 2-3 times a day. Wear athletic shoes that have air-sole or gel-sole cushions, or try soft shoe inserts that are designed for  plantar fasciitis. Elevate your foot above the level of your heart while you are sitting or lying down.  Activity Avoid activities that cause pain. Ask your health care provider what activities are safe for you. Do physical therapy exercises and stretches as told by your health care provider. Try activities and  forms of exercise that are easier on your joints (low impact). Examples include swimming, water aerobics, and biking. General instructions Take over-the-counter and prescription medicines only as told by your health care provider. Wear a night splint while sleeping, if told by your health care provider. Loosen the splint if your toes tingle, become numb, or turn cold and blue. Maintain a healthy weight, or work with your health care provider to lose weight as needed. Keep all follow-up visits. This is important. Contact a health care provider if you have: Symptoms that do not go away with home treatment. Pain that gets worse. Pain that affects your ability to move or do daily activities. Summary Plantar fasciitis is a painful foot condition that affects the heel. It occurs when the band of tissue that connects the toes to the heel bone (plantar fascia) becomes irritated. Heel pain is the main symptom of this condition. It may get worse after exercising too much or standing still for a long time. Treatment varies, but it usually starts with rest, ice, pressure (compression), and raising (elevating) the affected foot. This is called RICE therapy. Over-the-counter medicines can also be used to manage pain. This information is not intended to replace advice given to you by your health care provider. Make sure you discuss any questions you have with your healthcare provider. Document Revised: 03/01/2020 Document Reviewed: 03/01/2020 Elsevier Patient Education  2022 [employer].

## 2021-08-24 ENCOUNTER — Encounter: Payer: Self-pay | Admitting: *Deleted

## 2021-09-19 ENCOUNTER — Ambulatory Visit: Payer: BC Managed Care – PPO | Admitting: Family Medicine

## 2021-09-19 ENCOUNTER — Encounter: Payer: Self-pay | Admitting: Family Medicine

## 2021-09-19 ENCOUNTER — Other Ambulatory Visit: Payer: Self-pay

## 2021-09-19 VITALS — BP 108/60 | HR 60 | Ht 63.0 in | Wt 140.2 lb

## 2021-09-19 DIAGNOSIS — H6123 Impacted cerumen, bilateral: Secondary | ICD-10-CM

## 2021-09-19 DIAGNOSIS — Z23 Encounter for immunization: Secondary | ICD-10-CM | POA: Diagnosis not present

## 2021-09-19 DIAGNOSIS — H9201 Otalgia, right ear: Secondary | ICD-10-CM

## 2021-09-19 NOTE — Progress Notes (Signed)
Chief Complaint  Patient presents with   Ear Pain    Recurring ear pain. Always the right ear. Has about once a month. Takes motrin and it doesn't help. Her husband gave her an ear spray and it helped-label in [place]. Prevents and treats inflammation in outer ear. Andrey Farmer EN-she has pulled up on her phone).    Pain started in her R ear on 10/20. Used husband's spray on Friday and Saturday.  Pain resolved yesterday.  She had similar ear pain, always on the right in August, and mid-September, and then again last week.  Pain is on the inside of the ear, radiating behind the ear (like up to the back of the head). No popping/plugging, no decrease in hearing.  The drops she used that relieved her pain were "Otinova"--aluminum acetate, aluminum acetotartrate, acetic acid (described as anti-bacterial and anti-fungal effect, an astringent)  Used to use earplugs, stopped them a year ago when ear aches started. (Wore due to husband's snoring, now sleeping in separate rooms). Uses headphones  She has h/o tubes twice. Denies sore throat. No associated cold or allergy symptoms with the pain Denies clicking/popping of jaw or h/o TMJ. She did Invisalign, wearing retainers.  PMH, PSH, SH reviewed  Outpatient Encounter Medications as of 09/19/2021  Medication Sig Note   NON FORMULARY Take 1 tablet by mouth daily. 06/30/2019: Estrogen balance (maybe 5-7 times a week)   [DISCONTINUED] meloxicam (MOBIC) 15 MG tablet Take 1 tablet (15 mg total) by mouth daily. Take with food (Patient not taking: Reported on 07/13/2021)    [DISCONTINUED] Multiple Vitamins-Minerals (MULTIVITAMIN WITH MINERALS) tablet Take 1 tablet by mouth daily. (Patient not taking: Reported on 07/13/2021)    [DISCONTINUED] NON FORMULARY Take 1 tablet by mouth daily. (Patient not taking: Reported on 07/13/2021)    [DISCONTINUED] Probiotic Product (PRO-BIOTIC BLEND PO) Take 1 capsule by mouth daily. (Patient not taking: No sig reported)    No  facility-administered encounter medications on file as of 09/19/2021.   No Known Allergies  ROS: no fever, chills, URI symptoms, sore throat, cough, GI complaints.  No decrease in hearing. +ear pain per HPI.  No TMJ problems   PHYSICAL EXAM:  BP 108/60   Pulse 60   Ht 5\' 3"  (1.6 m)   Wt 140 lb 3.2 oz (63.6 kg)   LMP 09/16/2021 (Approximate)   BMI 24.84 kg/m   Well-appearing, pleasant female in no distress HEENT: conjunctiva and sclera are clear, EOMI. TM's--normal on the right, moderate amount of cerumen present, but easily able to see TM. Visualized portion of EAC is normal, no inflammation. The TM was almost completely obscured on the L. After ear lavage--very large amount (more than expected) was removed from the right.  Both TM's and EAC's were completely clear, and patient felt better after cerumen removal from both ears  Nontender TMJ, minimal congestion of nose L>R Sinuses nontender Neck: no lymphadenopathy or mass Heart: regular rate and rhythm Lungs: clear bilaterally Psych: normal mood, affect, hygiene and grooming Neuro: alert and oriented, cranial nerves grossly intact. Normal gait, strength   ASSESSMENT/PLAN:  Acute otalgia, right - Ddx reviewed.  Given large amount of cerumen that was removed (more than visible on initial exam), suspect this contributes.   Need for COVID-19 vaccine - Plan: Pfizer Covid-19 Vaccine Bivalent Booster  Need for influenza vaccination - Plan: Flu Vaccine QUAD 6+ mos PF IM (Fluarix Quad PF)  Bilateral impacted cerumen - resolved with bilateral ear lavage

## 2021-12-05 ENCOUNTER — Encounter: Payer: Self-pay | Admitting: Family Medicine

## 2021-12-05 ENCOUNTER — Ambulatory Visit: Payer: BC Managed Care – PPO | Admitting: Family Medicine

## 2021-12-05 ENCOUNTER — Other Ambulatory Visit: Payer: Self-pay

## 2021-12-05 VITALS — BP 104/64 | HR 60 | Ht 63.0 in | Wt 137.2 lb

## 2021-12-05 DIAGNOSIS — R519 Headache, unspecified: Secondary | ICD-10-CM | POA: Diagnosis not present

## 2021-12-05 MED ORDER — MELOXICAM 15 MG PO TABS: ORAL_TABLET | ORAL | 0 refills | Status: DC

## 2021-12-05 NOTE — Patient Instructions (Addendum)
Try and find out your last tetanus shot and any other vaccines (we only have your flu shot and COVID). Sounds like you probably need another tetanus shot (you reported having whooping cough vaccine (pertussis) in 2012, should be every 10 years).   Your neurologic exam is normal, nothing to suggest an underlying brain tumor or other problem. I suspect the problem is related to the muscles/tendons/connective tissue on the outside of the skull.  Try and keep your head in a neutral position when on the computer or reading or on your phone. Do neck exercise twice daily. Take anti-inflammatory once daily with food, regularly until your pain has completely resolved. You shouldn't take any ibuprofen (motrin/advil) or aleve (naproxen) or Goody/BC while taking the prescription. You CAN take tylenol along with the prescription.  You can also moist heat, stretches and massage when having the pain.  Do the stretches and strengthening exercises as shown (chin to chest, ear to shoulder, looking over the shoulder, and then the counter-pressure).  For the exercises on the handout--do them all 5-10 repetitions, twice daily.

## 2021-12-05 NOTE — Progress Notes (Signed)
Chief Complaint  Patient presents with   Pain    Pain in the back of her head, right side only-throbbing. No longer having any ear pain. Has been happening about once a month since last Spring. Still about once a month. Was a couple days over Thanksgiving. Then again 2 weeks after for about 3 days and then again yesterday. Does not feel like a headache-only throbbing.    Patient presents with complaint of pain at the back of the right side of the head. She previously had this associated with R ear pain, where the pain was 50/50 at the ear and head, and felt to be related to her ear.  She was last seen for ear pain in October, and had significant cerumen impaction treated. No longer has any ear pain.  Her current discomfort is described as a throbbing sensation. She had it stronger for 4-5 days over Thanksgiving.  She had been on a road trip, denies looking down or being on her phone much during the trip.  She was given Voltaren gel to use, which helped, though she isn't sure if it wasn't just going to get better on its own without treatment (as it often does). She reported that it resolved, but then 2 weeks later the throbbing came back, lasted x 2 d.  It then recurred yesterday.  She notes the throbbing only when she is up, not when in bed. There is never any soreness to touch. She works on the computer--has eye level, but a little off to the side.  She went back to the gym for the first time yesterday, taking a strength class.  She takes Ibuprofen or tylenol, as needed, but reports they don't help much.  She is asking about if she is due for bloodwork. Chart reviewed with patient--hasn't been here for CPE.  Limited records from GYN, with last labs from 09/2018. Reviewed immunizations--that we only have influenza and COVID. She recalls getting a "pertussis booster" in 2012, nothing since. She is aware of the new guidelines for colon cancer screening, hasn't done yet.   PMH, PSH, SH  reviewed  Outpatient Encounter Medications as of 12/05/2021  Medication Sig Note   [DISCONTINUED] NON FORMULARY Take 1 tablet by mouth daily. 06/30/2019: Estrogen balance (maybe 5-7 times a week)   No facility-administered encounter medications on file as of 12/05/2021.   No Known Allergies  ROS:  No f/c/n/v/d/urinary problems, rashes. No URI symptoms or allergy symptoms. No numbness, tingling, weakness, dizziness.   PHYSICAL EXAM:  BP 104/64    Pulse 60    Ht 5\' 3"  (1.6 m)    Wt 137 lb 3.2 oz (62.2 kg)    LMP 11/15/2021 (Approximate)    BMI 24.30 kg/m   Well-appearing, pleasant female, in no distress HEENT: conjunctiva and sclera are clear, EOMI, fundi benign. TM's and EACs normal, no cerumen. Area of discomfort is parieto-occipital. Nontender, no STS or mass. Neck: no lymphadenopathy, thyromegaly or mass.  She has FROM.  No muscular tenderness or spasm. Heart: regular rate and rhythm Lungs: clear bilaterally Back: no spinal or CVA tenderness Abdomen: soft, nontender, no mass Extremities: no edema Neuro: alert and oriented. Normal strength, sensation; DTR's symmetric.  Normal finger to nose, Romberg, tandem gait.  Psych: normal mood, affect, hygiene and grooming.  Worried/slightly anxious.  ASSESSMENT/PLAN:  Nonintractable episodic headache, unspecified headache type - suspect MSK, reassured pt of normal neurologic exam. to f/u if HA persist/worsen/change - Plan: meloxicam (MOBIC) 15 MG tablet  Pt  reassured that pain isn't likely from something INTRAcranial (her concern was a tumor). If she has persistent/worsening pain, nausea, or other neuro symptoms/findings, to return for re-evaluation. Otherwise, will focus on MSK etiology.  Poss related to position of computer.   Shown neck stretches and strengthening exercises. Reviewed risks of NSAIDs, and to use meloxicam now and with future flaress.  Pt advised to schedule CPE, to get any records/labs from GYN sent here so nothing is  duplicated. Aware that she will need a tetanus booster. To try and get [place] copies of other vaccines as well (has had travel vaccines in the past).  I spent 34 minutes dedicated to the care of this patient, including pre-visit review of records, face to face time, post-visit ordering of testing and documentation.

## 2022-01-02 ENCOUNTER — Other Ambulatory Visit: Payer: Self-pay | Admitting: Family Medicine

## 2022-01-02 DIAGNOSIS — R519 Headache, unspecified: Secondary | ICD-10-CM

## 2022-03-21 ENCOUNTER — Other Ambulatory Visit: Payer: Self-pay | Admitting: Family Medicine

## 2022-03-21 DIAGNOSIS — Z1231 Encounter for screening mammogram for malignant neoplasm of breast: Secondary | ICD-10-CM

## 2022-03-27 ENCOUNTER — Ambulatory Visit
Admission: RE | Admit: 2022-03-27 | Discharge: 2022-03-27 | Disposition: A | Discharge status: Home / Self Care | Payer: BC Managed Care – PPO | Source: Ambulatory Visit | Attending: Family Medicine | Admitting: Family Medicine

## 2022-03-27 DIAGNOSIS — Z1231 Encounter for screening mammogram for malignant neoplasm of breast: Secondary | ICD-10-CM

## 2022-05-02 ENCOUNTER — Ambulatory Visit: Payer: BC Managed Care – PPO | Admitting: Physician Assistant

## 2022-05-02 ENCOUNTER — Encounter: Payer: Self-pay | Admitting: Physician Assistant

## 2022-05-02 VITALS — BP 100/60 | HR 61 | Ht 63.0 in | Wt 138.8 lb

## 2022-05-02 DIAGNOSIS — H00011 Hordeolum externum right upper eyelid: Secondary | ICD-10-CM

## 2022-05-02 DIAGNOSIS — H00031 Abscess of right upper eyelid: Secondary | ICD-10-CM

## 2022-05-02 MED ORDER — CEFDINIR 300 MG PO CAPS: 300.0000 mg | ORAL_CAPSULE | Freq: Two times a day (BID) | ORAL | 0 refills | Status: DC

## 2022-05-02 MED ORDER — NEOMYCIN-POLYMYXIN-HC 3.5-10000-1 OP SUSP: 3.0000 [drp] | Freq: Four times a day (QID) | OPHTHALMIC | 0 refills | Status: DC

## 2022-05-02 NOTE — Progress Notes (Unsigned)
   Acute Office Visit  Subjective:    Patient ID: Misty Ellis, female    DOB: 1971-10-02, 51 y.o.   MRN: [id number]  Chief Complaint  Patient presents with   Acute Visit    Possible stye on right eye. She bought an over the counter cream but its not helping.    HPI Patient is in today for a stye on her right eye that is itchy, no crusting in eyes upon waking; has been using an OTC cream in her eyes, but it is not helping; no eye trauma or injury, no new facial products, wears eyelash extensions for > 1 yeaear, no glasses or contact lenses  Outpatient Medications Prior to Visit  Medication Sig Dispense Refill   meloxicam (MOBIC) 15 MG tablet Take 1 tablet by mouth with food once daily until pain resolves (Patient not taking: Reported on 05/02/2022) 30 tablet 0   No facility-administered medications prior to visit.    No Known Allergies  Review of Systems     Objective:    Physical Exam  BP 100/60   Pulse (!) 44   Wt 138 lb 12.8 oz (63 kg)   SpO2 100%   BMI 24.59 kg/m   Wt Readings from Last 3 Encounters:  05/02/22 138 lb 12.8 oz (63 kg)  12/05/21 137 lb 3.2 oz (62.2 kg)  09/19/21 140 lb 3.2 oz (63.6 kg)    Results for orders placed or performed in visit on 08/24/21  HM PAP SMEAR  Result Value Ref Range   HM Pap smear negative   Results Console HPV  Result Value Ref Range   CHL HPV Negative        Assessment & Plan:  There are no diagnoses linked to this encounter.   No orders of the defined types were placed in this encounter.   No follow-ups on file.  Irene Pap, PA-C

## 2022-05-03 ENCOUNTER — Encounter: Payer: Self-pay | Admitting: Physician Assistant

## 2022-05-03 NOTE — Patient Instructions (Signed)
If symptoms get worse, please call the office for a follow up appointment if available, otherwise go to Urgent Care, call 911 / EMS, or go to the Emergency Department for further evaluation.  

## 2022-08-02 ENCOUNTER — Encounter: Payer: Self-pay | Admitting: Internal Medicine

## 2022-09-04 DIAGNOSIS — N920 Excessive and frequent menstruation with regular cycle: Secondary | ICD-10-CM | POA: Diagnosis not present

## 2022-09-04 DIAGNOSIS — Z01419 Encounter for gynecological examination (general) (routine) without abnormal findings: Secondary | ICD-10-CM | POA: Diagnosis not present

## 2022-09-04 DIAGNOSIS — N816 Rectocele: Secondary | ICD-10-CM | POA: Diagnosis not present

## 2022-09-04 DIAGNOSIS — Z6825 Body mass index (BMI) 25.0-25.9, adult: Secondary | ICD-10-CM | POA: Diagnosis not present

## 2022-09-28 DIAGNOSIS — D259 Leiomyoma of uterus, unspecified: Secondary | ICD-10-CM | POA: Diagnosis not present

## 2022-10-06 ENCOUNTER — Ambulatory Visit: Payer: BC Managed Care – PPO | Admitting: Medical

## 2022-10-06 VITALS — BP 120/70 | HR 56 | Temp 97.8°F | Wt 144.2 lb

## 2022-10-06 DIAGNOSIS — H00014 Hordeolum externum left upper eyelid: Secondary | ICD-10-CM | POA: Diagnosis not present

## 2022-10-06 MED ORDER — ERYTHROMYCIN 5 MG/GM OP OINT
1.0000 | TOPICAL_OINTMENT | Freq: Every day | OPHTHALMIC | 0 refills | Status: DC
Start: 2022-10-06 — End: 2023-04-04

## 2022-10-06 NOTE — Progress Notes (Signed)
Subjective:  Misty Ellis is a 51 y.o. female who presents for Chief Complaint  Patient presents with   stye on left eye    Stye on left eye, no pain. Has alittle scab on the eye and not sure why     Here for concern of stye of the left upper eyelid.  It has been there about 8 days.  He now has a little scab on it.  No drainage, no eye discharge.  She had a stye on the right a few months ago.  She ended up having to use an antibiotic for that 1.  She notes that she uses cosmetic eyelashes that are done at the salon.  They do use a glue to help with the eyelash stick to her natural eyelashes.  But she has had 2 styes in the past 3 months given that she is done cosmetic eyelashes for the last 2 years.  She has not seen her eye doctor in a few years.  She feels like her vision is not the best in general.  She has been using warm compresses for the stye.  She denies any other eye issue, no redness, no eye pain, no URI symptoms.  No other aggravating or relieving factors.    No other c/o.  The following portions of the patient's history were reviewed and updated as appropriate: allergies, current medications, past family history, past medical history, past social history, past surgical history and problem list.  ROS Otherwise as in subjective above  Objective: BP 120/70   Pulse (!) 56   Temp 97.8 F (36.6 C)   Wt 144 lb 3.2 oz (65.4 kg)   BMI 25.54 kg/m   General appearance: alert, no distress, well developed, well nourished HEENT: normocephalic, sclerae anicteric, conjunctiva pink and moist, left upper eyelid centrally with a oval appearing mass right at the edge of the eyelid with a small crusted area.  No significant erythema or induration.  Otherwise eyelids and eyes appear normal.  PERRLA, EOMI. Handheld snellen 20/20 each eye uncorrected    Assessment: Encounter Diagnosis  Name Primary?   Hordeolum externum of left upper eyelid Yes     Plan: Advised to continue warm  compresses several times a day, begin erythromycin ointment at nighttime, practice good hygiene.  If not resolved in the next 5 to 7 days then will need to see an eye doctor for follow-up.  I advise she go and see an eye doctor anyhow for routine follow-up since she has not seen one in a few years.  She is due for routine screenings and regular eye care  Misty Ellis was seen today for stye on left eye.  Diagnoses and all orders for this visit:  Hordeolum externum of left upper eyelid  Other orders -     erythromycin ophthalmic ointment; Place 1 Application into the left eye at bedtime.    Follow up: prn

## 2022-10-09 ENCOUNTER — Ambulatory Visit (AMBULATORY_SURGERY_CENTER): Payer: BC Managed Care – PPO

## 2022-10-09 VITALS — Ht 63.0 in | Wt 143.0 lb

## 2022-10-09 DIAGNOSIS — Z1211 Encounter for screening for malignant neoplasm of colon: Secondary | ICD-10-CM

## 2022-10-09 DIAGNOSIS — Z800 Family history of malignant neoplasm of digestive organs: Secondary | ICD-10-CM

## 2022-10-09 MED ORDER — NA SULFATE-K SULFATE-MG SULF 17.5-3.13-1.6 GM/177ML PO SOLN: 1.0000 | Freq: Once | ORAL | 0 refills | Status: AC

## 2022-10-09 NOTE — Progress Notes (Signed)
No egg or soy allergy known to patient;  No issues known to pt with past sedation with any surgeries or procedures; Patient denies ever being told they had issues or difficulty with intubation;  No FH of Malignant Hyperthermia; Pt is not on diet pills; Pt is not on home 02;  Pt is not on blood thinners;  Pt denies issues with constipation;  No A fib or A flutter Have any cardiac testing pending--NO Pt instructed to use Singlecare.com or GoodRx for a price reduction on prep   Insurance verified during PV appt=[employer]  Patient's chart reviewed by Cathlyn Parsons CNRA prior to previsit and patient appropriate for the [facility].  Previsit completed and red dot placed by patient's name on their procedure day (on provider's schedule).    GoodRx coupon given to patient during PV appt;

## 2022-10-11 DIAGNOSIS — H00014 Hordeolum externum left upper eyelid: Secondary | ICD-10-CM | POA: Diagnosis not present

## 2022-11-01 ENCOUNTER — Encounter: Payer: Self-pay | Admitting: Internal Medicine

## 2022-11-07 ENCOUNTER — Encounter: Payer: Self-pay | Admitting: Internal Medicine

## 2022-11-07 ENCOUNTER — Ambulatory Visit (AMBULATORY_SURGERY_CENTER): Payer: BC Managed Care – PPO | Admitting: Internal Medicine

## 2022-11-07 VITALS — BP 102/61 | HR 54 | Temp 97.8°F | Resp 17 | Ht 63.0 in | Wt 143.0 lb

## 2022-11-07 DIAGNOSIS — Z1211 Encounter for screening for malignant neoplasm of colon: Secondary | ICD-10-CM

## 2022-11-07 MED ORDER — SODIUM CHLORIDE 0.9 % IV SOLN: 500.0000 mL | Freq: Once | INTRAVENOUS | Status: DC

## 2022-11-07 NOTE — Progress Notes (Signed)
Pt's states no medical or surgical changes since previsit or office visit. 

## 2022-11-07 NOTE — Op Note (Signed)
[facility] Patient Name: Misty Ellis Procedure Date: 11/07/2022 9:36 AM MRN: [id number] Endoscopist: Madelyn Brunner Aucilla , , [id number] Age: 51 Referring MD:  Date of Birth: 15-Oct-1971 Gender: Female Account #: [id number] Procedure:                Colonoscopy Indications:              Screening for colorectal malignant neoplasm, This                            is the patient's first colonoscopy Medicines:                Monitored Anesthesia Care Procedure:                Pre-Anesthesia Assessment:                           - Prior to the procedure, a History and Physical                            was performed, and patient medications and                            allergies were reviewed. The patient's tolerance of                            previous anesthesia was also reviewed. The risks                            and benefits of the procedure and the sedation                            options and risks were discussed with the patient.                            All questions were answered, and informed consent                            was obtained. Prior Anticoagulants: The patient has                            taken no anticoagulant or antiplatelet agents. ASA                            Grade Assessment: I - A normal, healthy patient.                            After reviewing the risks and benefits, the patient                            was deemed in satisfactory condition to undergo the                            procedure.  After obtaining informed consent, the colonoscope                            was passed under direct vision. Throughout the                            procedure, the patient's blood pressure, pulse, and                            oxygen saturations were monitored continuously. The                            Olympus PCF-H190DL (#[id number]) Colonoscope was                            introduced through the anus and  advanced to the the                            terminal ileum. The colonoscopy was performed                            without difficulty. The patient tolerated the                            procedure well. The quality of the bowel                            preparation was good. The terminal ileum, ileocecal                            valve, appendiceal orifice, and rectum were                            photographed. Scope In: 9:50:18 AM Scope Out: 10:05:08 AM Scope Withdrawal Time: 0 hours 8 minutes 24 seconds  Total Procedure Duration: 0 hours 14 minutes 50 seconds  Findings:                 The terminal ileum appeared normal.                           A few diverticula were found in the sigmoid colon.                           Non-bleeding small internal hemorrhoids were found                            during retroflexion. Complications:            No immediate complications. Estimated Blood Loss:     Estimated blood loss was minimal. Impression:               - The examined portion of the ileum was normal.                           - Diverticulosis in the sigmoid colon.                           -  Non-bleeding small internal hemorrhoids.                           - No specimens collected. Recommendation:           - Discharge patient to home (with escort).                           - Repeat colonoscopy in 10 years for screening                            purposes.                           - The findings and recommendations were discussed                            with the patient. Dr Particia Lather "Alan Ripper" Leonides Schanz,  11/07/2022 10:15:23 AM

## 2022-11-07 NOTE — Patient Instructions (Signed)
Handouts provided on diverticulosis and hemorrhoids.   Repeat colonoscopy in 10 years for screening purposes. Continue present medications.    YOU HAD AN ENDOSCOPIC PROCEDURE TODAY AT THE [facility]:   Refer to the procedure report that was given to you for any specific questions about what was found during the examination.  If the procedure report does not answer your questions, please call your gastroenterologist to clarify.  If you requested that your care partner not be given the details of your procedure findings, then the procedure report has been included in a sealed envelope for you to review at your convenience later.  YOU SHOULD EXPECT: Some feelings of bloating in the abdomen. Passage of more gas than usual.  Walking can help get rid of the air that was put into your GI tract during the procedure and reduce the bloating. If you had a lower endoscopy (such as a colonoscopy or flexible sigmoidoscopy) you may notice spotting of blood in your stool or on the toilet paper. If you underwent a bowel prep for your procedure, you may not have a normal bowel movement for a few days.  Please Note:  You might notice some irritation and congestion in your nose or some drainage.  This is from the oxygen used during your procedure.  There is no need for concern and it should clear up in a day or so.  SYMPTOMS TO REPORT IMMEDIATELY:  Following lower endoscopy (colonoscopy or flexible sigmoidoscopy):  Excessive amounts of blood in the stool  Significant tenderness or worsening of abdominal pains  Swelling of the abdomen that is new, acute  Fever of 100F or higher  For urgent or emergent issues, a gastroenterologist can be reached at any hour by calling [phone]. Do not use MyChart messaging for urgent concerns.    DIET:  We do recommend a small meal at first, but then you may proceed to your regular diet.  Drink plenty of fluids but you should avoid alcoholic beverages for 24  hours.  ACTIVITY:  You should plan to take it easy for the rest of today and you should NOT DRIVE or use heavy machinery until tomorrow (because of the sedation medicines used during the test).    FOLLOW UP: Our staff will call the number listed on your records the next business day following your procedure.  We will call around 7:15- 8:00 am to check on you and address any questions or concerns that you may have regarding the information given to you following your procedure. If we do not reach you, we will leave a message.     If any biopsies were taken you will be contacted by phone or by letter within the next 1-3 weeks.  Please call us at [phone] if you have not heard about the biopsies in 3 weeks.    SIGNATURES/CONFIDENTIALITY: You and/or your care partner have signed paperwork which will be entered into your electronic medical record.  These signatures attest to the fact that that the information above on your After Visit Summary has been reviewed and is understood.  Full responsibility of the confidentiality of this discharge information lies with you and/or your care-partner.  

## 2022-11-07 NOTE — Progress Notes (Signed)
Report to PACU, RN, vss, BBS= Clear.  

## 2022-11-07 NOTE — Progress Notes (Signed)
GASTROENTEROLOGY PROCEDURE H&P NOTE   Primary Care Physician: Joselyn Arrow, MD    Reason for Procedure:   Colon cancer screening  Plan:    Colonoscopy  Patient is appropriate for endoscopic procedure(s) in the ambulatory ([facility]) setting.  The nature of the procedure, as well as the risks, benefits, and alternatives were carefully and thoroughly reviewed with the patient. Ample time for discussion and questions allowed. The patient understood, was satisfied, and agreed to proceed.     HPI: Misty Ellis is a 51 y.o. female who presents for colonoscopy for colon cancer screening. Denies blood in stools, changes in bowel habits, or unintentional weight loss. Two of her grandparents developed colon cancer in their 2s.   Past Medical History:  Diagnosis Date   Abnormal Pap smear of cervix 2005, 2006   treated in [place]; no abnormals since having children   GERD (gastroesophageal reflux disease)    not on meds/elevates HOB to decrease symptoms    Past Surgical History:  Procedure Laterality Date   BLEPHAROPLASTY  2020   CERVIX SURGERY  2007    Prior to Admission medications   Medication Sig Start Date End Date Taking? Authorizing Provider  erythromycin ophthalmic ointment Place 1 Application into the left eye at bedtime. 10/06/22   Tysinger, Kermit Balo, PA-C    Current Outpatient Medications  Medication Sig Dispense Refill   erythromycin ophthalmic ointment Place 1 Application into the left eye at bedtime. 3.5 g 0   No current facility-administered medications for this visit.    Allergies as of 11/07/2022   (No Known Allergies)    Family History  Problem Relation Age of Onset   Asthma Mother    Colon cancer Maternal Grandfather 43   Colon cancer Paternal Grandfather 1   Colon polyps Neg Hx    Esophageal cancer Neg Hx    Stomach cancer Neg Hx    Rectal cancer Neg Hx     Social History   Socioeconomic History   Marital status: Married    Spouse name: Not on  file   Number of children: Not on file   Years of education: Not on file   Highest education level: Not on file  Occupational History   Not on file  Tobacco Use   Smoking status: Former    Packs/day: 0.50    Years: 7.00    Total pack years: 3.50    Types: Cigarettes   Smokeless tobacco: Never   Tobacco comments:    quit age 67  Vaping Use   Vaping Use: Never used  Substance and Sexual Activity   Alcohol use: Not Currently    Alcohol/week: 0.0 - 7.0 standard drinks of alcohol    Comment: 1-2 glasses 4-5 days/week   Drug use: Never   Sexual activity: Yes    Partners: Male    Birth control/protection: Condom  Other Topics Concern   Not on file  Social History Narrative   Married, lives with husband and 2 daughters (At [place]), 1 hamster.      Previously worked in [employer], lost job related to COVID-19.      From [place], in [place] since 2008.   Social Determinants of Health   Financial Resource Strain: Not on file  Food Insecurity: Not on file  Transportation Needs: Not on file  Physical Activity: Not on file  Stress: Not on file  Social Connections: Not on file  Intimate Partner Violence: Not on file    Physical Exam: Vital  signs in last 24 hours: There were no vitals taken for this visit. GEN: NAD EYE: Sclerae anicteric ENT: MMM CV: Non-tachycardic Pulm: No increased work of breathing GI: Soft, NT/ND NEURO:  Alert & Oriented   Eulah Pont, MD [facility]  11/07/2022 9:14 AM

## 2022-11-08 ENCOUNTER — Telehealth: Payer: Self-pay

## 2022-11-08 NOTE — Telephone Encounter (Signed)
Left message on follow up call. 

## 2022-11-16 DIAGNOSIS — H0014 Chalazion left upper eyelid: Secondary | ICD-10-CM | POA: Diagnosis not present

## 2022-11-16 DIAGNOSIS — D485 Neoplasm of uncertain behavior of skin: Secondary | ICD-10-CM | POA: Diagnosis not present

## 2022-11-16 DIAGNOSIS — H019 Unspecified inflammation of eyelid: Secondary | ICD-10-CM | POA: Diagnosis not present

## 2022-11-16 DIAGNOSIS — R234 Changes in skin texture: Secondary | ICD-10-CM | POA: Diagnosis not present

## 2022-12-21 DIAGNOSIS — N959 Unspecified menopausal and perimenopausal disorder: Secondary | ICD-10-CM | POA: Diagnosis not present

## 2023-02-28 DIAGNOSIS — Z713 Dietary counseling and surveillance: Secondary | ICD-10-CM | POA: Diagnosis not present

## 2023-02-28 DIAGNOSIS — Z6826 Body mass index (BMI) 26.0-26.9, adult: Secondary | ICD-10-CM | POA: Diagnosis not present

## 2023-04-02 ENCOUNTER — Encounter: Payer: Self-pay | Admitting: *Deleted

## 2023-04-03 NOTE — Progress Notes (Unsigned)
Misty Ellis is a 52 y.o. female who presents for a complete physical.   She sees GYN. Last notes we have was 04/2021 visit from Dr. Cherly Hensen. ?saw Dr. Algie Coffer since?  She has the following concerns:   Immunization History  Administered Date(s) Administered   Influenza Inj Mdck Quad Pf 09/23/2019   Influenza,inj,Quad PF,6+ Mos 09/19/2021   PFIZER(Purple Top)SARS-COV-2 Vaccination 02/04/2020, 02/25/2020, 10/20/2020   Pfizer Covid-19 Vaccine Bivalent Booster 69yrs & up 09/19/2021   Last Pap smear: 02/2020 Dr. Cherly Hensen normal with no high risk HPV.. Distant h/o abnl paps and fibroids.  Last GYN exam 04/2021 UPDATE Last mammogram: 03/2022 Last colonoscopy: 10/2022 Dr. Leonides Schanz; sigmoid diverticulosis, small internal hemorrhoids. 10 yr f/u rec Last DEXA: never Dentist: Ophtho: Exercise:  6/22 GYN did c-met, cbc, TSH, A1c, Total cholesterol and HDL.  All normal.   PMH, PSH, SH and FH were reviewed and updated.    ROS: The patient denies anorexia, fever, weight changes, headaches,  vision changes, decreased hearing, ear pain, sore throat, breast concerns, chest pain, palpitations, dizziness, syncope, dyspnea on exertion, cough, swelling, nausea, vomiting, diarrhea, constipation, abdominal pain, melena, hematochezia, indigestion/heartburn, hematuria, incontinence, dysuria, irregular menstrual cycles, vaginal discharge, odor or itch, genital lesions, joint pains, numbness, tingling, weakness, tremor, suspicious skin lesions, depression, anxiety, abnormal bleeding/bruising, or enlarged lymph nodes.  UPDATE ALL   PHYSICAL EXAM:  There were no vitals taken for this visit.  Wt Readings from Last 3 Encounters:  11/07/22 143 lb (64.9 kg)  10/09/22 143 lb (64.9 kg)  10/06/22 144 lb 3.2 oz (65.4 kg)   General Appearance:    Alert, cooperative, no distress, appears stated age  Head:    Normocephalic, without obvious abnormality, atraumatic  Eyes:    PERRL, conjunctiva/corneas clear, EOM's  intact, fundi benign  Ears:    Normal TM's and external ear canals  Nose:   Nares normal, mucosa normal, no drainage or sinus   tenderness  Throat:   Lips, mucosa, and tongue normal; teeth and gums normal  Neck:   Supple, no lymphadenopathy;  thyroid:  no enlargement/ tenderness/nodules; no carotid bruit or JVD  Back:    Spine nontender, no curvature, ROM normal, no CVA tenderness  Lungs:     Clear to auscultation bilaterally without wheezes, rales or ronchi; respirations unlabored  Chest Wall:    No tenderness or deformity   Heart:    Regular rate and rhythm, S1 and S2 normal, no murmur, rub or gallop  Breast Exam:    Deferred to GYN  Abdomen:     Soft, non-tender, nondistended, normoactive bowel sounds,    no masses, no hepatosplenomegaly  Genitalia:    Deferred to GYN     Extremities:   No clubbing, cyanosis or edema  Pulses:   2+ and symmetric all extremities  Skin:   Skin color, texture, turgor normal, no rashes or lesions  Lymph nodes:   Cervical, supraclavicular, and axillary nodes normal  Neurologic:   CNII-XII intact, normal strength, sensation and gait; reflexes 2+ and symmetric throughout          Psych:   Normal mood, affect, hygiene and grooming.      ASSESSMENT/PLAN:  Did she see GYN 04/2022 (prev saw Cousins, appt made with [facility] for 2023, no records received). GET GYN RECORDS IF SHE WENT If she didn't go, does she plan to??? Or am I doing breast/pelvic??   Did she get flu shot this year?  COVID booster? When was last Tdap?  Need  records (if was with pregnancy, if kids >10, needs another)  Remind to schedule mammo Shingrix age 42  Cbc, c-met, lipids Consider D TSH if sx HIV/hepC since no record  Discussed monthly self breast exams and yearly mammograms; at least 30 minutes of aerobic activity at least 5 days/week, weight-bearing exercise at least 2x/week; proper sunscreen use reviewed; healthy diet, including goals of calcium and vitamin D intake and alcohol  recommendations (less than or equal to 1 drink/day) reviewed; regular seatbelt use; carbon monoxide detectors, changing batteries in smoke detectors.  Immunization recommendations discussed--yearly flu shots recommended.  Shingrix age 47.  Risks/SE discussed, can schedule NV when convenient. Colonoscopy recommendations reviewed, UTD.

## 2023-04-03 NOTE — Patient Instructions (Incomplete)
  HEALTH MAINTENANCE RECOMMENDATIONS:  It is recommended that you get at least 30 minutes of aerobic exercise at least 5 days/week (for weight loss, you may need as much as 60-90 minutes). This can be any activity that gets your heart rate up. This can be divided in 10-15 minute intervals if needed, but try and build up your endurance at least once a week.  Weight bearing exercise is also recommended twice weekly.  Eat a healthy diet with lots of vegetables, fruits and fiber.  "Colorful" foods have a lot of vitamins (ie green vegetables, tomatoes, red peppers, etc).  Limit sweet tea, regular sodas and alcoholic beverages, all of which has a lot of calories and sugar.  Up to 1 alcoholic drink daily may be beneficial for women (unless trying to lose weight, watch sugars).  Drink a lot of water.  Calcium recommendations are 1200-1500 mg daily (1500 mg for postmenopausal women or women without ovaries), and vitamin D 1000 IU daily.  This should be obtained from diet and/or supplements (vitamins), and calcium should not be taken all at once, but in divided doses.  Monthly self breast exams and yearly mammograms for women over the age of 7 is recommended.  Sunscreen of at least SPF 30 should be used on all sun-exposed parts of the skin when outside between the hours of 10 am and 4 pm (not just when at beach or pool, but even with exercise, golf, tennis, and yard work!)  Use a sunscreen that says "broad spectrum" so it covers both UVA and UVB rays, and make sure to reapply every 1-2 hours.  Remember to change the batteries in your smoke detectors when changing your clock times in the spring and fall. Carbon monoxide detectors are recommended for your home.  Use your seat belt every time you are in a car, and please drive safely and not be distracted with cell phones and texting while driving.  I recommend getting the new shingles vaccine (Shingrix) once you are 50 years old. Schedule a nurse visit at our  office when convenient (based on the possible side effects as discussed).   This is a series of 2 injections, spaced 2 months apart.  It doesn't have to be exactly 2 months apart (but can't be sooner), if that isn't feasible for your schedule, but try and get them close to 2 months (and definitely within 6 months of each other, or else the efficacy of the vaccine drops off). This should be separated from other vaccines by at least 2 weeks.  Please schedule your yearly mammogram. Consider a routine eye exam in the next year or so.  I encourage you to consider counseling for yourself. Try and cut back on the alcohol due to it being a depressant. Consider other relaxation techniques to use instead of alcohol.  Try and limit alcohol.

## 2023-04-04 ENCOUNTER — Encounter: Payer: Self-pay | Admitting: Family Medicine

## 2023-04-04 ENCOUNTER — Ambulatory Visit (INDEPENDENT_AMBULATORY_CARE_PROVIDER_SITE_OTHER): Payer: BC Managed Care – PPO | Admitting: Family Medicine

## 2023-04-04 ENCOUNTER — Telehealth: Payer: Self-pay | Admitting: Family Medicine

## 2023-04-04 VITALS — BP 120/70 | HR 64 | Ht 63.0 in | Wt 146.0 lb

## 2023-04-04 DIAGNOSIS — F32A Depression, unspecified: Secondary | ICD-10-CM | POA: Diagnosis not present

## 2023-04-04 DIAGNOSIS — R5383 Other fatigue: Secondary | ICD-10-CM

## 2023-04-04 DIAGNOSIS — F419 Anxiety disorder, unspecified: Secondary | ICD-10-CM | POA: Diagnosis not present

## 2023-04-04 DIAGNOSIS — Z0000 Encounter for general adult medical examination without abnormal findings: Secondary | ICD-10-CM | POA: Diagnosis not present

## 2023-04-04 DIAGNOSIS — Z136 Encounter for screening for cardiovascular disorders: Secondary | ICD-10-CM | POA: Diagnosis not present

## 2023-04-04 MED ORDER — FLUOXETINE HCL 20 MG PO TABS: ORAL_TABLET | ORAL | 1 refills | Status: DC

## 2023-04-04 NOTE — Telephone Encounter (Signed)
P.A. FLUOXETINE tablets sent in.  Response was has the patient tried one of the following generic SSRIs: generic citalopram tablets, generic citalopram oral solution, generic escitalopram tablets, generic fluoxetine immediate-release capsules, generic fluoxetine oral solution, generic fluvoxamine immediate-release tablets, generic paroxetine HCl immediate-release tablets, generic sertraline tablets, generic sertraline oral solution?  This will probably be denied do you want to switch to capsules or one of the others?

## 2023-04-04 NOTE — Telephone Encounter (Signed)
I offered the patient capsules (10 mg, and then increase to 2 after a week) vs tablets (cutting them for the first week then taking the full pill, and she preferred the tablets, as this is what she had done for her daughter (taking the 10 mg tablet of the same medication).  You would need to check with her.  Pretty sure she wants to stay with the fluoxetine and not switch to one of the other SSRIs.  She can check with the pharmacy regarding the cost of the tablets out of pocket, vs switching to the 10 mg capsule (1/d, then increase to 2 after the first week, as tolerated), followed by a 20mg  capsule.   The tablets are mainly needed due to the splitting initially with the dose titration, for the first month only.  We could then switch to the capsule after that.  BUT, she did mention that she doesn't really like the idea of capsules, prefers tablets (feels like she is swallowing plastic when she takes capsules, feels like it isn't as good for her).  Please check with patient

## 2023-04-05 LAB — HIV ANTIBODY (ROUTINE TESTING W REFLEX): HIV Screen 4th Generation wRfx: NONREACTIVE

## 2023-04-05 LAB — COMPREHENSIVE METABOLIC PANEL
ALT: 11 IU/L (ref 0–32)
AST: 15 IU/L (ref 0–40)
Albumin/Globulin Ratio: 2 (ref 1.2–2.2)
Albumin: 4.5 g/dL (ref 3.9–4.9)
Alkaline Phosphatase: 61 IU/L (ref 44–121)
BUN/Creatinine Ratio: 12 (ref 9–23)
BUN: 10 mg/dL (ref 6–24)
Bilirubin Total: 0.4 mg/dL (ref 0.0–1.2)
CO2: 21 mmol/L (ref 20–29)
Calcium: 9 mg/dL (ref 8.7–10.2)
Chloride: 103 mmol/L (ref 96–106)
Creatinine, Ser: 0.86 mg/dL (ref 0.57–1.00)
Globulin, Total: 2.3 g/dL (ref 1.5–4.5)
Glucose: 76 mg/dL (ref 70–99)
Potassium: 4.5 mmol/L (ref 3.5–5.2)
Sodium: 139 mmol/L (ref 134–144)
Total Protein: 6.8 g/dL (ref 6.0–8.5)
eGFR: 83 mL/min/{1.73_m2} (ref >59)

## 2023-04-05 LAB — CBC WITH DIFFERENTIAL/PLATELET
Basophils Absolute: 0 10*3/uL (ref 0.0–0.2)
Basos: 1 %
EOS (ABSOLUTE): 0.1 10*3/uL (ref 0.0–0.4)
Eos: 1 %
Hematocrit: 38.6 % (ref 34.0–46.6)
Hemoglobin: 12.7 g/dL (ref 11.1–15.9)
Immature Grans (Abs): 0 10*3/uL (ref 0.0–0.1)
Immature Granulocytes: 0 %
Lymphocytes Absolute: 1.3 10*3/uL (ref 0.7–3.1)
Lymphs: 28 %
MCH: 28.4 pg (ref 26.6–33.0)
MCHC: 32.9 g/dL (ref 31.5–35.7)
MCV: 86 fL (ref 79–97)
Monocytes Absolute: 0.3 10*3/uL (ref 0.1–0.9)
Monocytes: 8 %
Neutrophils Absolute: 2.7 10*3/uL (ref 1.4–7.0)
Neutrophils: 62 %
Platelets: 308 10*3/uL (ref 150–450)
RBC: 4.47 x10E6/uL (ref 3.77–5.28)
RDW: 13.5 % (ref 11.7–15.4)
WBC: 4.4 10*3/uL (ref 3.4–10.8)

## 2023-04-05 LAB — LIPID PANEL
Chol/HDL Ratio: 3.3 ratio (ref 0.0–4.4)
Cholesterol, Total: 200 mg/dL — ABNORMAL HIGH (ref 100–199)
HDL: 61 mg/dL (ref >39)
LDL Chol Calc (NIH): 128 mg/dL — ABNORMAL HIGH (ref 0–99)
Triglycerides: 59 mg/dL (ref 0–149)
VLDL Cholesterol Cal: 11 mg/dL (ref 5–40)

## 2023-04-05 LAB — HEPATITIS C ANTIBODY: Hep C Virus Ab: NONREACTIVE

## 2023-04-05 LAB — TSH: TSH: 1.33 u[IU]/mL (ref 0.450–4.500)

## 2023-04-05 LAB — VITAMIN D 25 HYDROXY (VIT D DEFICIENCY, FRACTURES): Vit D, 25-Hydroxy: 34.7 ng/mL (ref 30.0–100.0)

## 2023-04-05 MED ORDER — FLUOXETINE HCL 20 MG PO TABS: ORAL_TABLET | ORAL | 1 refills | Status: DC

## 2023-04-05 NOTE — Telephone Encounter (Signed)
Pt wants to stay with tablets,  looked up on cost on Good rx & cheapest is at [facility] with card is $17.66, she wants to switch there, sent her discount card to her cell phone & switched Rx there.  Cancelled Rx at [facility]

## 2023-04-16 ENCOUNTER — Encounter: Payer: Self-pay | Admitting: Family Medicine

## 2023-04-25 DIAGNOSIS — Z713 Dietary counseling and surveillance: Secondary | ICD-10-CM | POA: Diagnosis not present

## 2023-04-25 DIAGNOSIS — Z6825 Body mass index (BMI) 25.0-25.9, adult: Secondary | ICD-10-CM | POA: Diagnosis not present

## 2023-06-11 ENCOUNTER — Other Ambulatory Visit: Payer: Self-pay | Admitting: Family Medicine

## 2023-06-11 DIAGNOSIS — Z1231 Encounter for screening mammogram for malignant neoplasm of breast: Secondary | ICD-10-CM

## 2023-06-22 ENCOUNTER — Ambulatory Visit
Admission: RE | Admit: 2023-06-22 | Discharge: 2023-06-22 | Disposition: A | Discharge status: Home / Self Care | Payer: BC Managed Care – PPO | Source: Ambulatory Visit | Attending: Family Medicine | Admitting: Family Medicine

## 2023-06-22 DIAGNOSIS — Z1231 Encounter for screening mammogram for malignant neoplasm of breast: Secondary | ICD-10-CM

## 2023-07-12 DIAGNOSIS — Z6824 Body mass index (BMI) 24.0-24.9, adult: Secondary | ICD-10-CM | POA: Diagnosis not present

## 2023-07-12 DIAGNOSIS — Z713 Dietary counseling and surveillance: Secondary | ICD-10-CM | POA: Diagnosis not present

## 2023-09-24 NOTE — Progress Notes (Unsigned)
No chief complaint on file.  Patient presents for follow-up on anxiety and depression. This was discussed at her physical in 03/2023, at which time she reported having significant anxiety issues related to her daughter's problems and anxiety. She felt bad the moment she woke up. Reading and walking helps, but she was interested in starting prozac, the same medication that her daughter takes.  She was started on 20mg  tablets of prozac, taking 1/2 tablet for the first week, then increased to the full 20 mg pill. She was NOT interested in any counseling. PHQ-9 score was 11, GAD-7 was 5, prior to starting medications.    She was started on progesterone and semaglutide by Dr. Vincente Poli. She had switched from oral to injectable semaglutide shortly before her last visit here (had taken 2 injections).  UPDATE    PMH, PSH, SH reviewed   ROS:   PHYSICAL EXAM:   There were no vitals taken for this visit.  Wt Readings from Last 3 Encounters:  04/04/23 146 lb (66.2 kg)  11/07/22 143 lb (64.9 kg)  10/09/22 143 lb (64.9 kg)         04/04/2023    9:45 AM  GAD 7 : Generalized Anxiety Score  Nervous, Anxious, on Edge 3  Control/stop worrying 1  Worry too much - different things 1  Trouble relaxing 0  Restless 0  Easily annoyed or irritable 0  Afraid - awful might happen 0  Total GAD 7 Score 5       04/04/2023    8:50 AM 12/05/2021    2:34 PM 09/19/2021    1:51 PM 06/30/2019   10:55 AM  Depression screen PHQ 2/9  Decreased Interest 3 0 0 0  Down, Depressed, Hopeless 2 0 0 0  PHQ - 2 Score 5 0 0 0  Altered sleeping 1     Tired, decreased energy 3     Change in appetite 0     Feeling bad or failure about yourself  2     Trouble concentrating 0     Moving slowly or fidgety/restless 0     Suicidal thoughts 0     PHQ-9 Score 11     Difficult doing work/chores Very difficult        ASSESSMENT/PLAN:  GAD-7, phq-9 Flu, covid  Refill prozac?--she was only prescribed #30 with 1  refill, so guessing not taking it????

## 2023-09-26 ENCOUNTER — Encounter: Payer: Self-pay | Admitting: Family Medicine

## 2023-09-26 ENCOUNTER — Ambulatory Visit: Payer: BC Managed Care – PPO | Admitting: Family Medicine

## 2023-09-26 VITALS — BP 108/62 | HR 60 | Wt 133.2 lb

## 2023-09-26 DIAGNOSIS — E663 Overweight: Secondary | ICD-10-CM

## 2023-09-26 DIAGNOSIS — Z23 Encounter for immunization: Secondary | ICD-10-CM | POA: Diagnosis not present

## 2023-09-26 DIAGNOSIS — F320 Major depressive disorder, single episode, mild: Secondary | ICD-10-CM

## 2023-09-26 DIAGNOSIS — F32A Depression, unspecified: Secondary | ICD-10-CM

## 2023-09-26 MED ORDER — BUPROPION HCL ER (XL) 150 MG PO TB24: ORAL_TABLET | ORAL | 0 refills | Status: DC

## 2023-09-26 MED ORDER — BUPROPION HCL ER (XL) 300 MG PO TB24: 300.0000 mg | ORAL_TABLET | Freq: Every day | ORAL | 1 refills | Status: DC

## 2023-09-26 NOTE — Patient Instructions (Addendum)
  Take wellbutrin XL 150 mg once daily in the morning for a week. If you are tolerating this well without side effects, then increase to taking TWO pills every morning. Use these up, and if doing well, pick up the 300 mg dose to continue with treatment (taking just one tablet of the higher dose medication). If you don't tolerate the 300 mg dose, but the 150 mg seems to be working well, let us know and we can send in a refill of the 150 mg for you to continue the lower dose for longer. You can't cut the pills in half due to the extended release coating.  I'd prefer the lower doses of semaglutide (2.4 is the maximum for [facility], which is the branded equivalent).

## 2023-10-03 ENCOUNTER — Telehealth: Payer: Self-pay | Admitting: *Deleted

## 2023-10-03 NOTE — Telephone Encounter (Signed)
Patient called regarding issues with bupropion. She is having sleep issues, waking several times a night. Waking with her heart racing. Has one more of the 150mg  left. Pharmacy only gave her #7 of the 150mg . I did call over there. Her insurance would only pay for #7 of the 150mg , they will not pay for 2 of the 150mg , only 300mg . So if you want her to stay on 150mg  longer will need new rx.

## 2023-10-03 NOTE — Telephone Encounter (Signed)
I SPECIFICALLY put a note to the pharmacy on the wellbutrin 150 mg prescription that if #60 wasn't covered, to change to #30.  I did NOT want her to only get 7. I'd like for her to stay on the 150 mg for longer (and see if the side effects improve, and if the lower dose helps). We can then determine if we keep it at 150mg  or try and increase it to 300mg . Remind her to take it first thing in the morning to minimize any sleep disruption.  You may need to check with pharmacy, but they should have additional 150 mg on file that they can dispense [employer] should have been able to cover #30 as I requested)

## 2023-10-03 NOTE — Telephone Encounter (Signed)
Patient advised and spoke with pharmacist, they could not fill #30 with the BID directions. So they went ahead and filled #30 once daily. Patient advised.

## 2023-10-11 ENCOUNTER — Telehealth: Payer: Self-pay | Admitting: *Deleted

## 2023-10-11 DIAGNOSIS — Z01419 Encounter for gynecological examination (general) (routine) without abnormal findings: Secondary | ICD-10-CM | POA: Diagnosis not present

## 2023-10-11 DIAGNOSIS — G4700 Insomnia, unspecified: Secondary | ICD-10-CM

## 2023-10-11 DIAGNOSIS — Z124 Encounter for screening for malignant neoplasm of cervix: Secondary | ICD-10-CM | POA: Diagnosis not present

## 2023-10-11 MED ORDER — TRAZODONE HCL 50 MG PO TABS: 25.0000 mg | ORAL_TABLET | Freq: Every evening | ORAL | 0 refills | Status: DC | PRN

## 2023-10-11 NOTE — Telephone Encounter (Signed)
Patient did continue the bupropropion 150mg , she is still not sleeping more than 4-5 hours a night and she is really tired. She wakes up at 3-3:30 every night and cannot get back to sleep. Goes to bed around 10:30-11:00. She did take a tylenol PM Tues night because she was so exhausted, still woke up but at 4 am and was able to get back to sleep/ Last night woke up again at 3:30am, unable to get back to sleep. Unsure of what to do.

## 2023-10-11 NOTE — Telephone Encounter (Signed)
Rx sent 

## 2023-10-11 NOTE — Telephone Encounter (Signed)
It definitely can be a side effect of the medication, but it can also sometimes be a symptoms of depression (or anxiety) in and of itself.  She can stop it for a week to see if her sleep improves.  If so, then we should switch courses, and try a different medication.  Likely OV to discuss which ones. If sleep not any better when not taking it, she can restart it, and we can add trazodone at bedtime to help with sleep. We can do that now as well, if she prefers that over stopping the med for a week. Or, she can continue to take tylenol PM (or plain benadryl) nightly for the next week or two and see how she does (and perhaps potentially then being able to increase the dose to 300mg , which likely will be the more effective dose for depression).

## 2023-10-11 NOTE — Telephone Encounter (Signed)
Spoke with patient and went over all options. She would like to try to continue on the wellbutrin for a few more weeks. She would like to try the trazodone with it now to see if that helps. Patient would like that sent to [facility]. Thanks.

## 2023-10-16 LAB — HM PAP SMEAR

## 2023-10-24 ENCOUNTER — Other Ambulatory Visit: Payer: Self-pay | Admitting: Family Medicine

## 2023-10-24 DIAGNOSIS — G4700 Insomnia, unspecified: Secondary | ICD-10-CM

## 2023-10-31 ENCOUNTER — Other Ambulatory Visit: Payer: Self-pay | Admitting: Family Medicine

## 2023-10-31 DIAGNOSIS — F320 Major depressive disorder, single episode, mild: Secondary | ICD-10-CM

## 2023-10-31 NOTE — Telephone Encounter (Signed)
Left message for patient asking if she needs this refill.

## 2023-11-01 ENCOUNTER — Encounter: Payer: Self-pay | Admitting: *Deleted

## 2023-11-01 NOTE — Telephone Encounter (Signed)
Left another message and sent a My Chart message as well.

## 2023-11-07 NOTE — Progress Notes (Unsigned)
No chief complaint on file.  Last seen 10/30 to follow-up on anxiety and depression. In May 2024 she was having a lot of anxiety related to her daughter's problems, anxiety.  PHQ-9 was 11, GAD-7 was 5. She was started on prozac 10mg  (1/2 tablet of 20mg )--took it at that dose for 3 wks, felt more anxious on the medications, so stopped it. Things were better at home, daughter was doing much better, back in school. She noticed she was feeling more isolated--away from family, not as involved with kids as they get older. She had reported going to counseling for her daughter, and that things were better, having more discussions rather than fights. At her October visit she reported that her anxiety has improved, having more isolated depression.  We started her on Wellbutrin XL 150 mg once daily in the morning for a week, to increase to 300 mg after a week if tolerating.  She contacted [place] on 11/14 stating that she was having trouble sleeping. She had stayed on the 150 mg dose (didn't increase). She reported not sleeping more than 4-5 hours a night. She was waking up at 3-3:30 every night and not able to get back to sleep. Once took Tylenol PM, still woke up but at 4 am but was able to get back to sleep. Options discussed at that time included stopping the wellbutrin for a week (to see if the insomnia is a side effect), vs continuing to take benadryl/PM medication, vs trial of Trazodone. She opted to stay on the wellbutrin and try trazodone. Sent in #30 with the following instructions: Take 0.5-1 tablets (25-50 mg total) by mouth at bedtime as needed for sleep. Can further increase dose to 1.5-2 tablets if lower doses aren't effective       PMH, PSH, SH reviewed   ROS:    PHYSICAL EXAM:  There were no vitals taken for this visit.      ASSESSMENT/PLAN:  Phq-9 and GAD-7

## 2023-11-08 ENCOUNTER — Ambulatory Visit: Payer: BC Managed Care – PPO | Admitting: Family Medicine

## 2023-11-08 ENCOUNTER — Encounter: Payer: Self-pay | Admitting: Family Medicine

## 2023-11-08 VITALS — BP 100/58 | HR 64 | Ht 63.0 in | Wt 126.0 lb

## 2023-11-08 DIAGNOSIS — F32A Depression, unspecified: Secondary | ICD-10-CM

## 2023-11-08 DIAGNOSIS — G4700 Insomnia, unspecified: Secondary | ICD-10-CM | POA: Diagnosis not present

## 2023-11-08 DIAGNOSIS — E663 Overweight: Secondary | ICD-10-CM

## 2023-11-08 DIAGNOSIS — F419 Anxiety disorder, unspecified: Secondary | ICD-10-CM | POA: Diagnosis not present

## 2023-11-08 MED ORDER — TRAZODONE HCL 50 MG PO TABS
25.0000 mg | ORAL_TABLET | Freq: Every evening | ORAL | 3 refills | Status: DC | PRN
Start: 2023-11-08 — End: 2024-04-23

## 2023-11-08 NOTE — Patient Instructions (Signed)
Resume trazodone at 1/2 tablet every night, just by itself for now. Do this for 1-2 weeks, get some good sleep and see how your moods are. If still depressed, these are the options we have-- You can resume the Wellbutrin 150 mg daily (in the morning) for 1-2 weeks.  If you're feeling better, you can keep it at the lower dose. If not better, but not having anxiety, you can try increasing it back to the 300 mg dose and stay there for 4 weeks. Then you should get a better idea of how well it is working for you. Use your relaxation techniques to help with stress/anxiety. If wellbutrin doesn't help you at all, we can consider a different SSRI such as lexapro. We discussed starting that at a very low dose to minimize anxiety and side effects.  Get a pill cutter

## 2023-11-14 DIAGNOSIS — Z6822 Body mass index (BMI) 22.0-22.9, adult: Secondary | ICD-10-CM | POA: Diagnosis not present

## 2023-11-14 DIAGNOSIS — Z713 Dietary counseling and surveillance: Secondary | ICD-10-CM | POA: Diagnosis not present

## 2023-11-15 ENCOUNTER — Telehealth: Payer: Self-pay | Admitting: Family Medicine

## 2023-11-15 ENCOUNTER — Other Ambulatory Visit: Payer: Self-pay | Admitting: Family Medicine

## 2023-11-15 DIAGNOSIS — F320 Major depressive disorder, single episode, mild: Secondary | ICD-10-CM

## 2023-11-15 MED ORDER — BUPROPION HCL ER (XL) 150 MG PO TB24: ORAL_TABLET | ORAL | 0 refills | Status: DC

## 2023-11-15 NOTE — Telephone Encounter (Signed)
 Dose was increased to 300 mg

## 2023-11-15 NOTE — Telephone Encounter (Signed)
Looks like Misty Ellis denied it earlier, stating dose was changed to 300mg . She had been OFF, and was restarting at the lower dose. We weren't sure how much she had left (and was advised to call us when refill is needed). She does need the 150mg , and refill was sent.

## 2023-11-15 NOTE — Telephone Encounter (Signed)
Pt called about the wellbutrin 150 mg being denied. I advised the messages I seen about if she was on 150 mg or 300 mg and she states she stopped taking it and just started back so she wants to start back at 150 mg, she says she doesn't want to start back with the 300 mg.

## 2023-11-16 NOTE — Telephone Encounter (Signed)
Pt was notified.  

## 2023-12-10 ENCOUNTER — Telehealth: Payer: Self-pay | Admitting: Family Medicine

## 2023-12-10 DIAGNOSIS — F320 Major depressive disorder, single episode, mild: Secondary | ICD-10-CM

## 2023-12-10 MED ORDER — BUPROPION HCL ER (XL) 300 MG PO TB24: 300.0000 mg | ORAL_TABLET | Freq: Every day | ORAL | 0 refills | Status: DC

## 2023-12-10 NOTE — Telephone Encounter (Signed)
 Pt requesting a refill on her Wellbutrin 300 mg. She says she has been taking the 150 mg twice a day and only has one day left. Uses  [id number] - [place], [place] - [address] AT [facility] OF [address] [facility] [address]

## 2024-01-16 ENCOUNTER — Encounter: Payer: Self-pay | Admitting: *Deleted

## 2024-01-16 NOTE — Progress Notes (Unsigned)
 No chief complaint on file.  Patient presents for 2 month follow-up on anxiety and depression. She had some difficulty sleeping when started on wellbutrin XL 150 mg. Adding trazodone helped with sleep (25 mg dose was effective).  She only increased to the 300 mg for 2 weeks before stopping it prior to her last visit in December. She was anxious about having to take a sleeping pill every night. She also thought the 300 mg dose made her feel a little more anxious. This was all discussed at her last visit, and the plan was to restart wellbutrin XL at 150mg  and increase up to 300 mg dose after a week. And to continue the trazodone nightly, if needed. We talked a lot about the importance of sleep, as well as proper diet, exercise, stress reduction. We had discussed switching to lexapro if she couldn't tolerate the 300 mg of wellbutrin, or if not adequately helping with her anxiety.  At her last visit she also reported noticing some hair loss and thinning, felt like it improved when she had gone off the wellbutrin. She had also been taking semaglutide for weight loss.  She had just started spacing out the injections (to every 10-14 days) at her last visit, having achieved a good weight. When taking wellbutrin, she previously noticed decrease in appetite (that increased upon stopping it before last visit).  Today she reports    PMH, PSH, SH reviewed   ROS:    PHYSICAL EXAM:  There were no vitals taken for this visit.  Wt Readings from Last 3 Encounters:  11/08/23 126 lb (57.2 kg)  09/26/23 133 lb 3.2 oz (60.4 kg)  04/04/23 146 lb (66.2 kg)        ASSESSMENT/PLAN:   Phq-9 and GAD-7

## 2024-01-17 ENCOUNTER — Encounter: Payer: Self-pay | Admitting: Family Medicine

## 2024-01-17 ENCOUNTER — Ambulatory Visit: Payer: BC Managed Care – PPO | Admitting: Family Medicine

## 2024-01-17 VITALS — BP 104/60 | HR 60 | Ht 63.0 in | Wt 124.2 lb

## 2024-01-17 DIAGNOSIS — F32A Depression, unspecified: Secondary | ICD-10-CM

## 2024-01-17 DIAGNOSIS — F419 Anxiety disorder, unspecified: Secondary | ICD-10-CM

## 2024-01-17 DIAGNOSIS — M79645 Pain in left finger(s): Secondary | ICD-10-CM | POA: Diagnosis not present

## 2024-01-17 DIAGNOSIS — G4700 Insomnia, unspecified: Secondary | ICD-10-CM | POA: Diagnosis not present

## 2024-01-17 DIAGNOSIS — E663 Overweight: Secondary | ICD-10-CM

## 2024-01-17 NOTE — Patient Instructions (Addendum)
 Please try and get weight-bearing exercise at least 2x/week. Be sure to eat adequate protein in your diet so that you don't lose muscle.  Call the pharmacy when you need refills on your medications.  Try and use a stand for your phone when reading to see if that eliminates the sharp pains in your fingers.

## 2024-03-07 ENCOUNTER — Other Ambulatory Visit: Payer: Self-pay | Admitting: Family Medicine

## 2024-03-07 DIAGNOSIS — F320 Major depressive disorder, single episode, mild: Secondary | ICD-10-CM

## 2024-03-07 NOTE — Telephone Encounter (Signed)
 Appt is in June with Dr. Lynelle Doctor

## 2024-04-02 DIAGNOSIS — Z6821 Body mass index (BMI) 21.0-21.9, adult: Secondary | ICD-10-CM | POA: Diagnosis not present

## 2024-04-02 DIAGNOSIS — N951 Menopausal and female climacteric states: Secondary | ICD-10-CM | POA: Diagnosis not present

## 2024-04-02 DIAGNOSIS — Z713 Dietary counseling and surveillance: Secondary | ICD-10-CM | POA: Diagnosis not present

## 2024-04-22 ENCOUNTER — Other Ambulatory Visit: Payer: Self-pay | Admitting: Family Medicine

## 2024-04-22 DIAGNOSIS — G4700 Insomnia, unspecified: Secondary | ICD-10-CM

## 2024-04-23 NOTE — Telephone Encounter (Signed)
 Is this okay to refill?

## 2024-05-14 NOTE — Progress Notes (Deleted)
 No chief complaint on file.   Misty Ellis is a 53 y.o. female who presents for a complete physical.   She sees GYN.   Anxiety and Depression:  At last visit in 12/2023 she reported that her energy and moods were better. She was taking Wellbutrin  XL 300 mg daily, and also trazodone  nightly, sleeping well. (Prior to wellbutrin , tried Prozac --didn't tolerate it, felt bad,more anxious).  She had been taking semaglutide for weight loss prescribed by Dr. Serge Dancer.  She had just started spacing out the injections (to every 10-14 days) in 10/2023,  having achieved a good weight, stopped mid-December.  Anxiety was noted to be worse when on semaglutide (esp when taken with wellbutrin ). She resumed semaglutidein February, felt more anxious.    She stopped taking semaglutide in mid-December.  She noted that her anxiety improved when she stopped it (was worse when on both semaglutide and wellbutrin  together).  She restarted semaglutide in February, at the lower dose.  She noted feeling a little more anxious since restarting. Notes some mild constipation since restarting semaglutide. She states her goal is 53 Kg. Took a break on the medication while in [place].  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   ***  In February she noted  getting short-lived, piercing pain at her left 3rd and 4th distal fingers--at DIP's to the fingertps.  It occurs every day, multiple times a day, once an hour or less, short-lived. She holds her phone for extended periods with her left hand, while reading. She wonders if this can contribute. Sometimes notices a discomfort when grasping the steering wheel.  Denies numbness, tingling, weakness. Denies stiffness or swelling in the fingers.    Immunization History  Administered  Date(s) Administered   Influenza Inj Mdck Quad Pf 09/23/2019   Influenza, Seasonal, Injecte, Preservative Fre 09/26/2023   Influenza,inj,Quad PF,6+ Mos 09/19/2021   Influenza-Unspecified 07/28/2022   PFIZER(Purple Top)SARS-COV-2 Vaccination 02/04/2020, 02/25/2020, 10/20/2020   Pfizer Covid-19 Vaccine Bivalent Booster 49yrs & up 09/19/2021   Pfizer(Comirnaty)Fall Seasonal Vaccine 12 years and older 07/28/2022, 09/26/2023   Tdap 04/11/2017   Last Pap smear: 02/2020 Dr. Lesta Rater normal with no high risk HPV.. Distant h/o abnl paps and fibroids.  Last GYN exam Fall 2023 with Dr. Ricky Charter (no records received) Last mammogram: 05/2023 Last colonoscopy: 10/2022 Dr. Rosaline Coma; sigmoid diverticulosis, small internal hemorrhoids. 10 yr f/u rec Last DEXA: never Dentist: twice yearly Ophtho: not since she got her glasses (about 3 years ago). *** Exercise:   walks 4-5x/week, 45-60 minutes.  Has dumbbells, uses sporadically.  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  Lab Results  Component Value Date   CHOL 200 (H) 04/04/2023   HDL 61 04/04/2023   LDLCALC 128 (H) 04/04/2023   TRIG 59 04/04/2023   CHOLHDL 3.3 04/04/2023     PMH, PSH, SH and FH were reviewed and updated.     ROS: The patient denies fever, weight changes, headaches,  vision changes, decreased hearing, ear pain, sore throat, breast concerns, chest pain, palpitations, dizziness, syncope, dyspnea on exertion, cough, swelling, nausea, vomiting, diarrhea, constipation, abdominal pain, melena, hematochezia, indigestion/heartburn, hematuria, incontinence, dysuria, irregular menstrual cycles, vaginal discharge, odor or itch, genital lesions, joint pains, numbness, tingling, weakness, tremor, suspicious skin  lesions, depression, anxiety, abnormal bleeding/bruising, or enlarged lymph nodes.  Decreased appetite since starting compounded semaglutide injections.  Craving less sugar. *** Depression/anxiety per HPI, related to stress with daughter's anxiety.  ***    PHYSICAL EXAM:  There were no vitals taken for this visit.  Wt Readings from Last 3 Encounters:  01/17/24 124 lb 3.2 oz (56.3 kg)  11/08/23 126 lb (57.2 kg)  09/26/23 133 lb 3.2 oz (60.4 kg)  09/26/23 133 lb 3.2 oz (60.4 kg) 04/04/23 146 lb (66.2 kg)  11/07/22 143 lb (64.9 kg)    General Appearance:    Alert, cooperative, no distress, appears stated age  Head:    Normocephalic, without obvious abnormality, atraumatic  Eyes:    PERRL, conjunctiva/corneas clear, EOM's intact, fundi benign  Ears:    Normal TM's and external ear canals  Nose:   Nares normal, mucosa normal, no drainage or sinus   tenderness  Throat:   Lips, mucosa, and tongue normal; teeth and gums normal  Neck:   Supple, no lymphadenopathy;  thyroid :  no enlargement/ tenderness/nodules; no carotid bruit or JVD  Back:    Spine nontender, no curvature, ROM normal, no CVA tenderness  Lungs:     Clear to auscultation bilaterally without wheezes, rales or ronchi; respirations unlabored  Chest Wall:    No tenderness or deformity   Heart:    Regular rate and rhythm, S1 and S2 normal, no murmur, rub or gallop  Breast Exam:    Deferred to GYN  Abdomen:     Soft, non-tender, nondistended, normoactive bowel sounds,    no masses, no hepatosplenomegaly  Genitalia:    Deferred to GYN     Extremities:   No clubbing, cyanosis or edema  Pulses:   2+ and symmetric all extremities  Skin:   Skin color, texture, turgor normal, no rashes or lesions (limited exam, not changed into gown, back examined)  Lymph nodes:   Cervical, supraclavicular nodes normal  Neurologic:   CNII-XII intact, normal strength, sensation and gait; reflexes 2+ and symmetric throughout          Psych:   Slightly anxious mood, normal affect, hygiene and grooming.         01/17/2024    3:41 PM 11/08/2023    3:57 PM 09/26/2023    4:01 PM 04/04/2023    8:50 AM 12/05/2021    2:34 PM  Depression screen PHQ 2/9  Decreased Interest 1 3 2 3  0  Down, Depressed,  Hopeless 0 3 2 2  0  PHQ - 2 Score 1 6 4 5  0  Altered sleeping 0 1 0 1   Tired, decreased energy 0 3 3 3    Change in appetite 0 0 0 0   Feeling bad or failure about yourself  0 0 0 2   Trouble concentrating 0 3 1 0   Moving slowly or fidgety/restless 0 0 0 0   Suicidal thoughts 0 0 0 0   PHQ-9 Score 1 13 8 11    Difficult doing work/chores Not difficult at all Not difficult at all Not difficult at all Very difficult       01/17/2024    3:43 PM 11/08/2023    3:59 PM 09/26/2023    4:02 PM 04/04/2023    9:45 AM  GAD 7 : Generalized Anxiety Score  Nervous, Anxious, on Edge 1 3 0 3  Control/stop worrying 0 3 0 1  Worry too much - different things 0 0 0 1  Trouble  relaxing 0 0 0 0  Restless 0 0 0 0  Easily annoyed or irritable 0 0 0 0  Afraid - awful might happen 0 0 0 0  Total GAD 7 Score 1 6 0 5  Anxiety Difficulty Not difficult at all Not difficult at all Not difficult at all       ASSESSMENT/PLAN:  Phq-9 and gad-7 only if moods not doing well.  We did these in 12/2023  Last GYN notes we have were from 09/2022 Dr Ricky Charter. If she has gone since, please get results  Prevnar-20 OR shingrix. Doesn't necessarily need any labs if no changes to diet or any symptoms. Could offer cbc, c-met, lipids as routine panel (+/- TSH) if having fatigue, or desires labs.   Discussed monthly self breast exams and yearly mammograms; at least 30 minutes of aerobic activity at least 5 days/week, weight-bearing exercise at least 2x/week; proper sunscreen use reviewed; healthy diet, including goals of calcium and vitamin D  intake and alcohol recommendations (less than or equal to 1 drink/day) reviewed; regular seatbelt use; carbon monoxide detectors, changing batteries in smoke detectors.  Immunization recommendations discussed--yearly flu shots recommended.   Shingrix    Discussed new recommendation for prevnar-20 at age 48  Colonoscopy recommendations reviewed, UTD.

## 2024-05-14 NOTE — Patient Instructions (Incomplete)
   HEALTH MAINTENANCE RECOMMENDATIONS:  It is recommended that you get at least 30 minutes of aerobic exercise at least 5 days/week (for weight loss, you may need as much as 60-90 minutes). This can be any activity that gets your heart rate up. This can be divided in 10-15 minute intervals if needed, but try and build up your endurance at least once a week.  Weight bearing exercise is also recommended twice weekly.  Eat a healthy diet with lots of vegetables, fruits and fiber.  "Colorful" foods have a lot of vitamins (ie green vegetables, tomatoes, red peppers, etc).  Limit sweet tea, regular sodas and alcoholic beverages, all of which has a lot of calories and sugar.  Up to 1 alcoholic drink daily may be beneficial for women (unless trying to lose weight, watch sugars).  Drink a lot of water.  Calcium recommendations are 1200-1500 mg daily (1500 mg for postmenopausal women or women without ovaries), and vitamin D 1000 IU daily.  This should be obtained from diet and/or supplements (vitamins), and calcium should not be taken all at once, but in divided doses.  Monthly self breast exams and yearly mammograms for women over the age of 109 is recommended.  Sunscreen of at least SPF 30 should be used on all sun-exposed parts of the skin when outside between the hours of 10 am and 4 pm (not just when at beach or pool, but even with exercise, golf, tennis, and yard work!)  Use a sunscreen that says "broad spectrum" so it covers both UVA and UVB rays, and make sure to reapply every 1-2 hours.  Remember to change the batteries in your smoke detectors when changing your clock times in the spring and fall. Carbon monoxide detectors are recommended for your home.  Use your seat belt every time you are in a car, and please drive safely and not be distracted with cell phones and texting while driving.

## 2024-05-15 ENCOUNTER — Encounter: Payer: BC Managed Care – PPO | Admitting: Family Medicine

## 2024-05-15 ENCOUNTER — Telehealth: Payer: Self-pay | Admitting: *Deleted

## 2024-05-15 DIAGNOSIS — F32A Depression, unspecified: Secondary | ICD-10-CM

## 2024-05-15 DIAGNOSIS — F320 Major depressive disorder, single episode, mild: Secondary | ICD-10-CM

## 2024-05-15 DIAGNOSIS — G4700 Insomnia, unspecified: Secondary | ICD-10-CM

## 2024-05-15 DIAGNOSIS — Z0000 Encounter for general adult medical examination without abnormal findings: Secondary | ICD-10-CM

## 2024-05-15 DIAGNOSIS — Z23 Encounter for immunization: Secondary | ICD-10-CM

## 2024-05-15 NOTE — Telephone Encounter (Signed)
This patient no showed for their appointment today.Which of the following is necessary for this patient.   A) No follow-up necessary   B) Follow-up urgent. Locate Patient Immediately.   C) Follow-up necessary. Contact patient and Schedule visit in ____ Days.   D) Follow-up Advised. Contact patient and Schedule visit in ____ Days.   E) Please Send no show letter to patient. Charge no show fee if no show was a CPE.   

## 2024-06-10 ENCOUNTER — Encounter: Payer: Self-pay | Admitting: Family Medicine

## 2024-06-16 ENCOUNTER — Other Ambulatory Visit: Payer: Self-pay | Admitting: Family Medicine

## 2024-06-16 DIAGNOSIS — G4700 Insomnia, unspecified: Secondary | ICD-10-CM

## 2024-06-17 ENCOUNTER — Telehealth: Payer: Self-pay | Admitting: *Deleted

## 2024-06-17 NOTE — Telephone Encounter (Signed)
 Patient is taking 1 tablet per night and her insurance requires a 90 day rx.

## 2024-06-17 NOTE — Telephone Encounter (Signed)
 I'm sending in a 90d supply as requested. But she no-showed her June appointment for CPE. She had said she needed a CPE for work. She has no f/u appts or CPE scheduled.

## 2024-06-17 NOTE — Telephone Encounter (Signed)
 Left message asking patient is she needs refill and what dose she is taking.

## 2024-06-17 NOTE — Telephone Encounter (Signed)
 Copied from CRM [id number]. Topic: General - Other >> Jun 17, 2024  9:29 AM Treva T wrote: Reason for CRM: Patient states she is returning a call regarding message of medication inquiry. Per chart review message seen for inquiring on name of medication and dose.  Per patient states, the name of the medication she needs a refill on is traZODone  (DESYREL ) 50 MG tablet. Per patient also states that per insurance, needs a 90 day supply of medication.  Preferred pharmacy:   [id number] [place], [place] - [address] AT [facility] [address] [facility] [address] & [facility] [address] [place] [address] Phone: [phone] Fax: [phone]  Patient can be reached with any further questions at [phone].  Message sent to provider.

## 2024-06-22 ENCOUNTER — Other Ambulatory Visit: Payer: Self-pay | Admitting: Family Medicine

## 2024-06-22 DIAGNOSIS — F320 Major depressive disorder, single episode, mild: Secondary | ICD-10-CM

## 2024-08-06 ENCOUNTER — Telehealth: Payer: Self-pay | Admitting: *Deleted

## 2024-08-06 NOTE — Telephone Encounter (Signed)
 Labs were all normal last year. They don't need to be done every year, unless having problems or symptoms. It makes more sense for us  to discuss at her visit, and then decide if/what labs are needed.

## 2024-08-06 NOTE — Telephone Encounter (Signed)
 Copied from CRM [id number]. Topic: Clinical - Request for Lab/Test Order >> Aug 06, 2024  8:09 AM Donna BRAVO wrote: Reason for CRM: patient would like labs ordered and drawn before appt on 08/20/24    and would like a prescription for the new COIVD Vaccine  Pfizer  [id number] - [place],  - [address] AT [facility] OF [facility] [address] & [facility] [address] [place] [address] Phone: [phone] Fax: [phone]   Need to know what labs you would like and I will schedule lab appt. I already did rx for covid vaccine.

## 2024-08-07 NOTE — Telephone Encounter (Signed)
 Left message, will MyChart message as well.

## 2024-08-19 NOTE — Patient Instructions (Incomplete)
  HEALTH MAINTENANCE RECOMMENDATIONS:  It is recommended that you get at least 30 minutes of aerobic exercise at least 5 days/week (for weight loss, you may need as much as 60-90 minutes). This can be any activity that gets your heart rate up. This can be divided in 10-15 minute intervals if needed, but try and build up your endurance at least once a week.  Weight bearing exercise is also recommended twice weekly.  Eat a healthy diet with lots of vegetables, fruits and fiber.  Colorful foods have a lot of vitamins (ie green vegetables, tomatoes, red peppers, etc).  Limit sweet tea, regular sodas and alcoholic beverages, all of which has a lot of calories and sugar.  Up to 1 alcoholic drink daily may be beneficial for women (unless trying to lose weight, watch sugars).  Drink a lot of water.  Calcium recommendations are 1200-1500 mg daily (1500 mg for postmenopausal women or women without ovaries), and vitamin D  1000 IU daily.  This should be obtained from diet and/or supplements (vitamins), and calcium should not be taken all at once, but in divided doses.  Monthly self breast exams and yearly mammograms for women over the age of 59 is recommended.  Sunscreen of at least SPF 30 should be used on all sun-exposed parts of the skin when outside between the hours of 10 am and 4 pm (not just when at beach or pool, but even with exercise, golf, tennis, and yard work!)  Use a sunscreen that says broad spectrum so it covers both UVA and UVB rays, and make sure to reapply every 1-2 hours.  Remember to change the batteries in your smoke detectors when changing your clock times in the spring and fall. Carbon monoxide detectors are recommended for your home.  Use your seat belt every time you are in a car, and please drive safely and not be distracted with cell phones and texting while driving.  Consider trying nasal saline spray before bedtime, and in the middle of the night when things feel very dry.  I  recommend scheduling nurse visits for the shingles vaccine at your convenience (waiting at least 2 weeks from today's vaccines).  These are 2 shot given 2 months apart.  I do recommend getting the COVID vaccine, waiting 2 weeks from today's vaccines.  We currently don't have our supply You can get it from the pharmacy or check back here in 2 weeks.  You are due for your mammogram (last was in 05/2023) .Please all the [facility] to schedule this.  Please schedule a routine eye exam.  Please try and get SOME exercise daily--this might help with your energy. Try and do strength training at least 2x/week.

## 2024-08-19 NOTE — Progress Notes (Unsigned)
 No chief complaint on file.  Misty Ellis is a 53 y.o. female who presents for a complete physical.   She sees GYN.    Anxiety and Depression:  At last visit in 12/2023 she reported that her energy and moods were better. She was taking Wellbutrin  XL 300 mg daily, and also trazodone  nightly, sleeping well. (Prior to wellbutrin , tried Prozac --didn't tolerate it, felt bad, more anxious).   She had been taking semaglutide for weight loss prescribed by Dr. Mat.  She had just started spacing out the injections (to every 10-14 days) in 10/2023,  having achieved a good weight, stopped mid-December.  Anxiety was noted to be worse when on semaglutide (esp when taken with wellbutrin ). She resumed semaglutide in February at lower dose, noted a little more anxiety, along with mild constipation. She previously stated her goal is 53 Kg.    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   ***   Immunization History  Administered Date(s) Administered   Influenza Inj Mdck Quad Pf 09/23/2019   Influenza, Seasonal, Injecte, Preservative Fre 09/26/2023   Influenza,inj,Quad PF,6+ Mos 09/19/2021   Influenza-Unspecified 07/28/2022   PFIZER(Purple Top)SARS-COV-2 Vaccination 02/04/2020, 02/25/2020, 10/20/2020   Pfizer Covid-19 Vaccine Bivalent Booster 73yrs & up 09/19/2021   Pfizer(Comirnaty)Fall Seasonal Vaccine 12 years and older 07/28/2022, 09/26/2023   Tdap 04/11/2017     Last Pap smear: 02/2020 Dr. Rutherford normal with no high risk HPV.. Distant h/o abnl paps and fibroids.  Last GYN exam Fall 2023 with Dr. Barbette (no records received) Last mammogram: 05/2023 Last colonoscopy: 10/2022 Dr. Federico; sigmoid diverticulosis, small internal hemorrhoids. 10 yr f/u rec Last DEXA: never Dentist: twice yearly Ophtho:  not since she got her glasses (about 3 years ago). *** Exercise:    walks 4-5x/week, 45-60 minutes.  Has dumbbells, uses sporadically.  Occ does body pump class at [facility].   Recent Labs       Lab Results  Component Value Date    CHOL 200 (H) 04/04/2023    HDL 61 04/04/2023    LDLCALC 128 (H) 04/04/2023    TRIG 59 04/04/2023    CHOLHDL 3.3 04/04/2023          PMH, PSH, SH and FH were reviewed and updated.         ROS: The patient denies fever, weight changes, headaches,  vision changes, decreased hearing, ear pain, sore throat, breast concerns, chest pain, palpitations, dizziness, syncope, dyspnea on exertion, cough, swelling, nausea, vomiting, diarrhea, constipation, abdominal pain, melena, hematochezia, indigestion/heartburn, hematuria, incontinence, dysuria, irregular menstrual cycles, vaginal discharge, odor or itch, genital lesions, joint pains, numbness, tingling, weakness, tremor, suspicious skin lesions, depression, anxiety, abnormal bleeding/bruising, or enlarged lymph nodes.   Last year--short-lived, piercing pain at her left 3rd and 4th distal fingers--at DIP's to the fingertps.  It occurs every day, multiple times a day, once an hour or less, short-lived. She holds her phone for extended periods with her left hand, while reading. She wonders if this can contribute. Sometimes notices a discomfort when grasping the steering wheel.  Denies numbness, tingling, weakness. ***UPDATE  Denies stiffness or swelling in the fingers. Decreased appetite since starting compounded semaglutide injections.  Craving less sugar. *** Depression/anxiety per HPI, related to stress  with daughter's anxiety. ***       PHYSICAL EXAM:   There were no vitals taken for this visit.      Wt Readings from Last 3 Encounters:  01/17/24 124 lb 3.2 oz (56.3 kg)  11/08/23 126 lb (57.2 kg)  09/26/23 133 lb 3.2 oz (60.4 kg)  10/30/24133 lb 3.2 oz (60.4 kg) 04/04/23 146 lb (66.2 kg)  11/07/22 143 lb  (64.9 kg)      General Appearance:    Alert, cooperative, no distress, appears stated age  Head:    Normocephalic, without obvious abnormality, atraumatic  Eyes:    PERRL, conjunctiva/corneas clear, EOM's intact, fundi benign  Ears:    Normal TM's and external ear canals  Nose:   Nares normal, mucosa normal, no drainage or sinus   tenderness  Throat:   Lips, mucosa, and tongue normal; teeth and gums normal  Neck:   Supple, no lymphadenopathy;  thyroid :  no enlargement/ tenderness/nodules; no carotid bruit or JVD  Back:    Spine nontender, no curvature, ROM normal, no CVA tenderness  Lungs:     Clear to auscultation bilaterally without wheezes, rales or ronchi; respirations unlabored  Chest Wall:    No tenderness or deformity   Heart:    Regular rate and rhythm, S1 and S2 normal, no murmur, rub or gallop  Breast Exam:    Deferred to GYN  Abdomen:     Soft, non-tender, nondistended, normoactive bowel sounds,    no masses, no hepatosplenomegaly  Genitalia:    Deferred to GYN       Extremities:   No clubbing, cyanosis or edema  Pulses:   2+ and symmetric all extremities  Skin:   Skin color, texture, turgor normal, no rashes or lesions (limited exam, not changed into gown, back examined)  Lymph nodes:   Cervical, supraclavicular nodes normal  Neurologic:   CNII-XII intact, normal strength, sensation and gait; reflexes 2+ and symmetric throughout                                Psych:   Slightly anxious mood, normal affect, hygiene and grooming.                       01/17/2024    3:41 PM 11/08/2023    3:57 PM 09/26/2023    4:01 PM 04/04/2023    8:50 AM 12/05/2021    2:34 PM  Depression screen PHQ 2/9  Decreased Interest 1 3 2 3  0  Down, Depressed, Hopeless 0 3 2 2  0  PHQ - 2 Score 1 6 4 5  0  Altered sleeping 0 1 0 1    Tired, decreased energy 0 3 3 3     Change in appetite 0 0 0 0    Feeling bad or failure about yourself  0 0 0 2    Trouble concentrating 0 3 1 0    Moving slowly or  fidgety/restless 0 0 0 0    Suicidal thoughts 0 0 0 0    PHQ-9 Score 1 13 8 11     Difficult doing work/chores Not difficult at all Not difficult at all Not difficult at all Very difficult          01/17/2024    3:43 PM 11/08/2023    3:59 PM 09/26/2023    4:02 PM 04/04/2023    9:45 AM  GAD 7 : Generalized Anxiety  Score  Nervous, Anxious, on Edge 1 3 0 3  Control/stop worrying 0 3 0 1  Worry too much - different things 0 0 0 1  Trouble relaxing 0 0 0 0  Restless 0 0 0 0  Easily annoyed or irritable 0 0 0 0  Afraid - awful might happen 0 0 0 0  Total GAD 7 Score 1 6 0 5  Anxiety Difficulty Not difficult at all Not difficult at all Not difficult at all            ASSESSMENT/PLAN:   Phq-9 and gad-7 only if moods not doing well.  We did these in 12/2023   Last GYN notes we have were from 09/2022 Dr Barbette. If she has gone since, please get results   Flu shot Also needs Prevnar-20 (vs patient getting Capvaxive from pharmacy) OR shingrix. Did she get COVID vaccine from pharmacy (she requested Rx)  Doesn't necessarily need any labs if no changes to diet or any symptoms.  Past due for mammo unless she got at GYN (and we would need to get)   Discussed monthly self breast exams and yearly mammograms; at least 30 minutes of aerobic activity at least 5 days/week, weight-bearing exercise at least 2x/week; proper sunscreen use reviewed; healthy diet, including goals of calcium and vitamin D  intake and alcohol recommendations (less than or equal to 1 drink/day) reviewed; regular seatbelt use; carbon monoxide detectors, changing batteries in smoke detectors.  Immunization recommendations discussed--yearly flu shots recommended.  *** Prevnar-20 vs capvaxive 21 *** Shingrix  *** COVID booster   Colonoscopy recommendations reviewed, UTD.

## 2024-08-20 ENCOUNTER — Encounter: Payer: Self-pay | Admitting: Family Medicine

## 2024-08-20 ENCOUNTER — Ambulatory Visit: Admitting: Family Medicine

## 2024-08-20 VITALS — BP 102/60 | HR 68 | Ht 63.0 in | Wt 122.8 lb

## 2024-08-20 DIAGNOSIS — F419 Anxiety disorder, unspecified: Secondary | ICD-10-CM | POA: Diagnosis not present

## 2024-08-20 DIAGNOSIS — Z0000 Encounter for general adult medical examination without abnormal findings: Secondary | ICD-10-CM

## 2024-08-20 DIAGNOSIS — G4700 Insomnia, unspecified: Secondary | ICD-10-CM | POA: Diagnosis not present

## 2024-08-20 DIAGNOSIS — Z23 Encounter for immunization: Secondary | ICD-10-CM

## 2024-08-20 DIAGNOSIS — F32A Depression, unspecified: Secondary | ICD-10-CM

## 2024-08-20 DIAGNOSIS — R5383 Other fatigue: Secondary | ICD-10-CM

## 2024-08-20 DIAGNOSIS — F320 Major depressive disorder, single episode, mild: Secondary | ICD-10-CM

## 2024-08-20 DIAGNOSIS — L659 Nonscarring hair loss, unspecified: Secondary | ICD-10-CM | POA: Diagnosis not present

## 2024-08-20 MED ORDER — BUPROPION HCL ER (XL) 300 MG PO TB24: 300.0000 mg | ORAL_TABLET | Freq: Every day | ORAL | 3 refills | Status: AC

## 2024-09-26 ENCOUNTER — Other Ambulatory Visit: Payer: Self-pay | Admitting: Family Medicine

## 2024-09-26 DIAGNOSIS — Z1231 Encounter for screening mammogram for malignant neoplasm of breast: Secondary | ICD-10-CM

## 2024-10-08 DIAGNOSIS — Z713 Dietary counseling and surveillance: Secondary | ICD-10-CM | POA: Diagnosis not present

## 2024-10-08 DIAGNOSIS — Z6822 Body mass index (BMI) 22.0-22.9, adult: Secondary | ICD-10-CM | POA: Diagnosis not present

## 2024-10-08 DIAGNOSIS — N951 Menopausal and female climacteric states: Secondary | ICD-10-CM | POA: Diagnosis not present

## 2024-10-31 ENCOUNTER — Inpatient Hospital Stay: Admission: RE | Admit: 2024-10-31 | Discharge: 2024-10-31 | Attending: Family Medicine | Admitting: Family Medicine

## 2024-10-31 DIAGNOSIS — Z1231 Encounter for screening mammogram for malignant neoplasm of breast: Secondary | ICD-10-CM
